# Patient Record
Sex: Female | Born: 1970 | Race: Black or African American | Hispanic: No | Marital: Single | Smoking: Never smoker
Health system: Southern US, Community
[De-identification: ages and names within clinical notes are randomized; demographics above are authoritative.]

## PROBLEM LIST (undated history)

## (undated) DIAGNOSIS — E7800 Pure hypercholesterolemia, unspecified: Secondary | ICD-10-CM

## (undated) HISTORY — PX: CARPAL TUNNEL RELEASE: SHX101

## (undated) HISTORY — PX: FOOT SURGERY: SHX648

## (undated) HISTORY — PX: ABDOMINAL HYSTERECTOMY: SHX81

---

## 2007-09-26 ENCOUNTER — Emergency Department (HOSPITAL_COMMUNITY): Admission: EM | Admit: 2007-09-26 | Discharge: 2007-09-27 | Payer: Self-pay | Admitting: Emergency Medicine

## 2009-05-20 ENCOUNTER — Emergency Department (HOSPITAL_COMMUNITY): Admission: EM | Admit: 2009-05-20 | Discharge: 2009-05-20 | Payer: Self-pay | Admitting: Emergency Medicine

## 2011-05-21 LAB — COMPREHENSIVE METABOLIC PANEL WITH GFR
ALT: 19
AST: 20
Alkaline Phosphatase: 48
CO2: 21
Chloride: 103
GFR calc Af Amer: >60
GFR calc non Af Amer: >60
Glucose, Bld: 130 — ABNORMAL HIGH
Potassium: 3.3 — ABNORMAL LOW
Sodium: 134 — ABNORMAL LOW
Total Bilirubin: 1.2

## 2011-05-21 LAB — URINALYSIS, ROUTINE W REFLEX MICROSCOPIC
Bilirubin Urine: NEGATIVE
Glucose, UA: NEGATIVE
Hgb urine dipstick: NEGATIVE
Ketones, ur: NEGATIVE
Nitrite: NEGATIVE
Protein, ur: NEGATIVE
Specific Gravity, Urine: 1.013
Urobilinogen, UA: 0.2
pH: 5.5

## 2011-05-21 LAB — DIFFERENTIAL
Basophils Absolute: 0
Basophils Relative: 0
Eosinophils Absolute: 0
Eosinophils Relative: 0
Lymphocytes Relative: 4 — ABNORMAL LOW
Lymphs Abs: 0.2 — ABNORMAL LOW
Monocytes Absolute: 0.5
Monocytes Relative: 8
Neutro Abs: 5.5
Neutrophils Relative %: 88 — ABNORMAL HIGH

## 2011-05-21 LAB — COMPREHENSIVE METABOLIC PANEL
Albumin: 3.6
BUN: 5 — ABNORMAL LOW
Calcium: 8.7
Creatinine, Ser: 0.97
Total Protein: 7.4

## 2011-05-21 LAB — POCT PREGNANCY, URINE
Operator id: 151321
Preg Test, Ur: NEGATIVE

## 2011-05-21 LAB — CBC
HCT: 38.8
Hemoglobin: 13.4
MCHC: 34.7
MCV: 88.3
Platelets: 244
RBC: 4.39
RDW: 11.6
WBC: 6.2

## 2011-06-20 ENCOUNTER — Emergency Department (HOSPITAL_BASED_OUTPATIENT_CLINIC_OR_DEPARTMENT_OTHER)
Admission: EM | Admit: 2011-06-20 | Discharge: 2011-06-20 | Disposition: A | Discharge status: Home / Self Care | Payer: BC Managed Care – PPO | Attending: Emergency Medicine | Admitting: Emergency Medicine

## 2011-06-20 ENCOUNTER — Encounter: Payer: Self-pay | Admitting: Emergency Medicine

## 2011-06-20 ENCOUNTER — Emergency Department (INDEPENDENT_AMBULATORY_CARE_PROVIDER_SITE_OTHER): Payer: BC Managed Care – PPO

## 2011-06-20 ENCOUNTER — Other Ambulatory Visit: Payer: Self-pay

## 2011-06-20 DIAGNOSIS — M79609 Pain in unspecified limb: Secondary | ICD-10-CM | POA: Insufficient documentation

## 2011-06-20 DIAGNOSIS — R079 Chest pain, unspecified: Secondary | ICD-10-CM | POA: Insufficient documentation

## 2011-06-20 DIAGNOSIS — R071 Chest pain on breathing: Secondary | ICD-10-CM | POA: Insufficient documentation

## 2011-06-20 DIAGNOSIS — R0789 Other chest pain: Secondary | ICD-10-CM

## 2011-06-20 DIAGNOSIS — M542 Cervicalgia: Secondary | ICD-10-CM | POA: Insufficient documentation

## 2011-06-20 HISTORY — DX: Pure hypercholesterolemia, unspecified: E78.00

## 2011-06-20 LAB — DIFFERENTIAL
Basophils Absolute: 0 10*3/uL (ref 0.0–0.1)
Lymphocytes Relative: 47 % — ABNORMAL HIGH (ref 12–46)
Monocytes Absolute: 0.2 10*3/uL (ref 0.1–1.0)
Neutro Abs: 1.6 10*3/uL — ABNORMAL LOW (ref 1.7–7.7)
Neutrophils Relative %: 46 % (ref 43–77)

## 2011-06-20 LAB — CARDIAC PANEL(CRET KIN+CKTOT+MB+TROPI)
CK, MB: 4 ng/mL (ref 0.3–4.0)
Total CK: 334 U/L — ABNORMAL HIGH (ref 7–177)
Troponin I: <0.3 ng/mL (ref <0.30)

## 2011-06-20 LAB — CBC
HCT: 40.1 % (ref 36.0–46.0)
RDW: 12.7 % (ref 11.5–15.5)
WBC: 3.6 10*3/uL — ABNORMAL LOW (ref 4.0–10.5)

## 2011-06-20 LAB — D-DIMER, QUANTITATIVE: D-Dimer, Quant: 0.23 ug/mL-FEU (ref 0.00–0.48)

## 2011-06-20 MED ORDER — IBUPROFEN 800 MG PO TABS
800.0000 mg | ORAL_TABLET | Freq: Once | ORAL | Status: AC
  Administered 2011-06-20: 800 mg via ORAL
  Filled 2011-06-20: qty 1

## 2011-06-20 MED ORDER — ASPIRIN 81 MG PO CHEW
324.0000 mg | CHEWABLE_TABLET | Freq: Once | ORAL | Status: AC
  Administered 2011-06-20: 324 mg via ORAL
  Filled 2011-06-20: qty 4

## 2011-06-20 NOTE — ED Notes (Signed)
Pt c/o LT chest pain - sharp, intermittent, worse when lying down- since yest am- denies other sx

## 2011-06-20 NOTE — ED Provider Notes (Signed)
History     CSN: [id number] Arrival date & time: 06/20/2011  9:45 AM   First MD Initiated Contact with Patient 06/20/11 0945      Chief Complaint  Patient presents with  . Chest Pain    (Consider location/radiation/quality/duration/timing/severity/associated sxs/prior treatment) Patient is a 40 y.o. female presenting with chest pain.  Chest Pain The chest pain began yesterday. Duration of episode(s) is 1 day. Chest pain occurs constantly. The chest pain is unchanged. Associated with: Worsens with lying down. At its most intense, the pain is at 10/10. The pain is currently at 7/10. The quality of the pain is described as sharp. The pain radiates to the left neck and left arm. Chest pain is worsened by certain positions. Primary symptoms include palpitations and abdominal pain. Pertinent negatives for primary symptoms include no fever, no syncope, no shortness of breath, no cough, no nausea and no vomiting.  The palpitations did not occur with shortness of breath.  She tried nothing for the symptoms. Risk factors include alcohol intake and post-menopausal (NOrmally does not drink but was at [facility] and came home Monday.).     Past Medical History  Diagnosis Date  . Endometriosis   . High cholesterol     Past Surgical History  Procedure Date  . Abdominal hysterectomy   . Foot surgery     No family history on file.  History  Substance Use Topics  . Smoking status: Never Smoker   . Smokeless tobacco: Not on file  . Alcohol Use: Yes     occ    OB History    Grav Para Term Preterm Abortions TAB SAB Ect Mult Living                  Review of Systems  Constitutional: Negative for fever.  Respiratory: Negative for cough and shortness of breath.   Cardiovascular: Positive for chest pain and palpitations. Negative for syncope.  Gastrointestinal: Positive for abdominal pain. Negative for nausea and vomiting.  All other systems reviewed and are  negative.    Allergies  Cephalosporins  Home Medications   Current Outpatient Rx  Name Route Sig Dispense Refill  . GABAPENTIN (PHN) PO Oral Take by mouth daily.        Ht 5\' 6"  (1.676 m)  Wt 190 lb (86.183 kg)  BMI 30.67 kg/m2  Physical Exam  Nursing note and vitals reviewed. Constitutional: She is oriented to person, place, and time. She appears well-developed.  HENT:  Head: Normocephalic and atraumatic.  Right Ear: External ear normal.  Nose: Nose normal.  Mouth/Throat: Oropharynx is clear and moist.  Eyes: Conjunctivae and EOM are normal. Pupils are equal, round, and reactive to light.  Neck: Normal range of motion. Neck supple.  Cardiovascular: Normal rate, regular rhythm, normal heart sounds and intact distal pulses.   Pulmonary/Chest: Effort normal and breath sounds normal. She exhibits tenderness.       Tender left costochondral conjunction  Abdominal: Soft.  Musculoskeletal: Normal range of motion.  Neurological: She is alert and oriented to person, place, and time. She has normal reflexes.  Skin: Skin is warm and dry.  Psychiatric: She has a normal mood and affect. Her behavior is normal. Judgment and thought content normal.    ED Course  Procedures (including critical care time)  Labs Reviewed - No data to display No results found.   No diagnosis found.    MDM   Date: 06/20/2011  Rate: 71  Rhythm: normal sinus  rhythm  QRS Axis: normal  Intervals: normal  ST/T Wave abnormalities: normal  Conduction Disutrbances:none  Narrative Interpretation:   Old EKG Reviewed: none available          Hilario Quarry, MD 06/20/11 1102

## 2012-12-07 IMAGING — CR DG CHEST 2V
2 series · 2 of 2 positions shown · non-contrast
Comparison: None

CLINICAL DATA: 40-year-old female with chest pain

CHEST - 2 VIEW

[w chest pa]
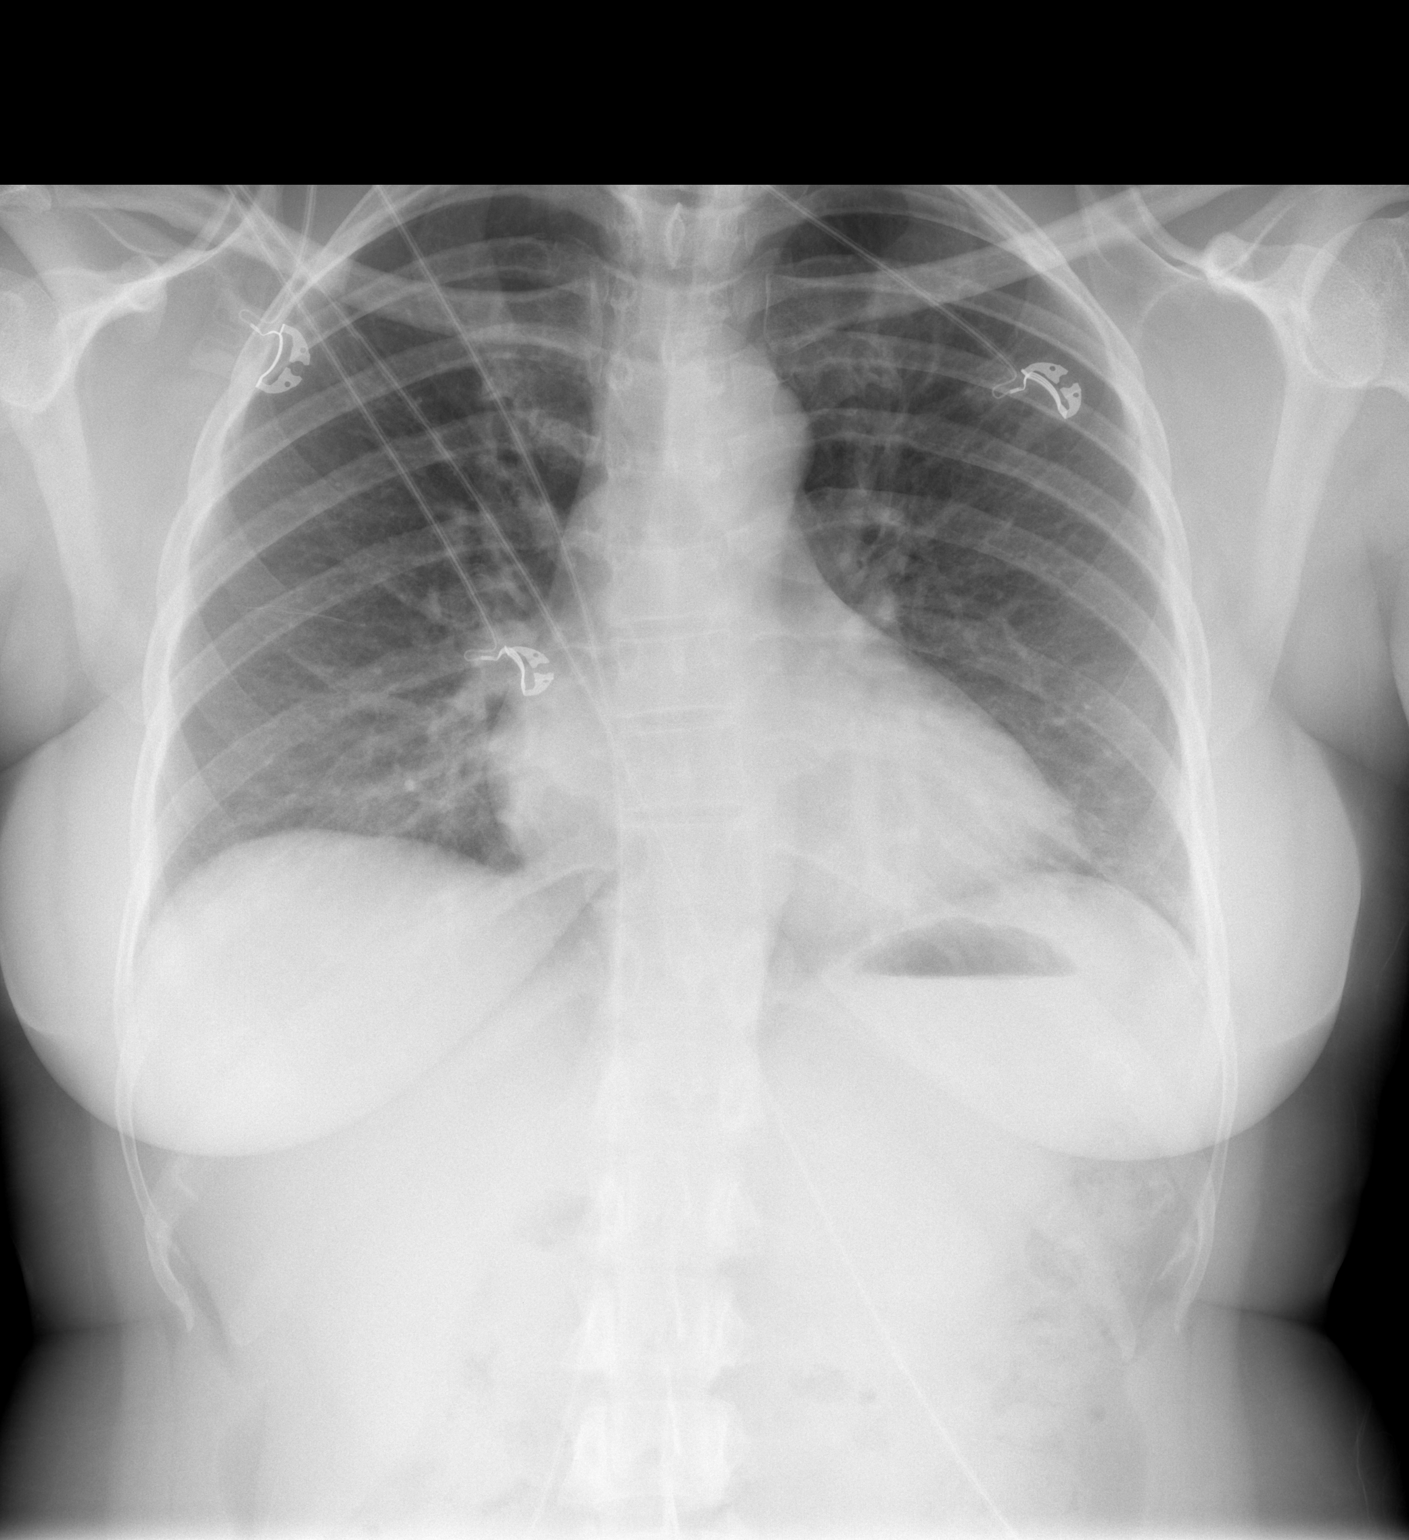

[w chest lat]
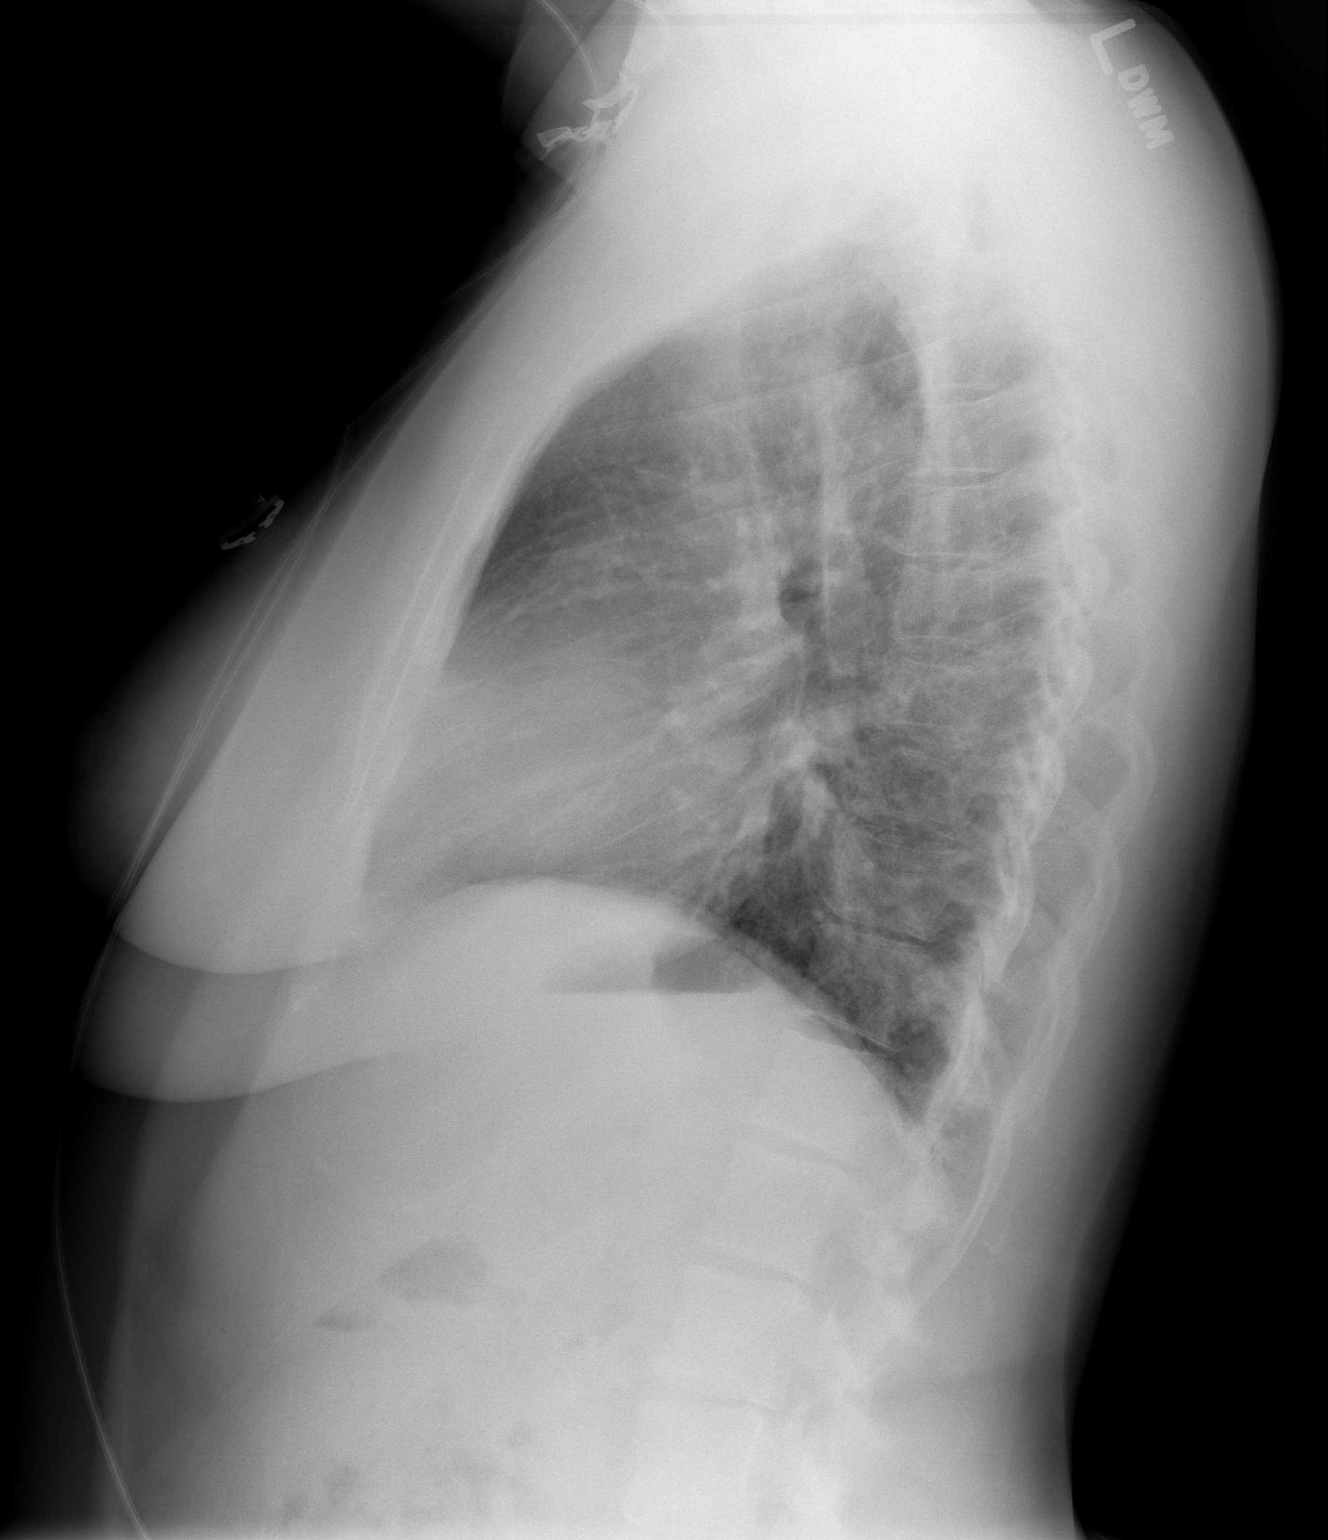

[2 of 2 positions shown; findings below may reference images not displayed]

FINDINGS: The cardiomediastinal silhouette is unremarkable.

There is no evidence of focal airspace disease, pulmonary edema,
pulmonary nodule/mass, pleural effusion, or pneumothorax.
No acute bony abnormalities are identified.
IMPRESSION: No evidence of active cardiopulmonary disease.

## 2019-07-27 ENCOUNTER — Encounter (HOSPITAL_COMMUNITY): Payer: Self-pay

## 2019-07-27 ENCOUNTER — Inpatient Hospital Stay (HOSPITAL_COMMUNITY)
Admission: EM | Admit: 2019-07-27 | Discharge: 2019-08-02 | DRG: 177 | Disposition: A | Discharge status: Home / Self Care | Payer: BC Managed Care – PPO | Attending: Internal Medicine | Admitting: Internal Medicine

## 2019-07-27 ENCOUNTER — Other Ambulatory Visit: Payer: Self-pay

## 2019-07-27 ENCOUNTER — Emergency Department (HOSPITAL_COMMUNITY): Payer: BC Managed Care – PPO

## 2019-07-27 DIAGNOSIS — E785 Hyperlipidemia, unspecified: Secondary | ICD-10-CM | POA: Diagnosis present

## 2019-07-27 DIAGNOSIS — Z90710 Acquired absence of both cervix and uterus: Secondary | ICD-10-CM

## 2019-07-27 DIAGNOSIS — F09 Unspecified mental disorder due to known physiological condition: Secondary | ICD-10-CM | POA: Diagnosis not present

## 2019-07-27 DIAGNOSIS — N809 Endometriosis, unspecified: Secondary | ICD-10-CM | POA: Diagnosis present

## 2019-07-27 DIAGNOSIS — T380X5A Adverse effect of glucocorticoids and synthetic analogues, initial encounter: Secondary | ICD-10-CM | POA: Diagnosis not present

## 2019-07-27 DIAGNOSIS — Z6830 Body mass index (BMI) 30.0-30.9, adult: Secondary | ICD-10-CM | POA: Diagnosis not present

## 2019-07-27 DIAGNOSIS — U071 COVID-19: Secondary | ICD-10-CM | POA: Diagnosis present

## 2019-07-27 DIAGNOSIS — M549 Dorsalgia, unspecified: Secondary | ICD-10-CM | POA: Diagnosis present

## 2019-07-27 DIAGNOSIS — Y92239 Unspecified place in hospital as the place of occurrence of the external cause: Secondary | ICD-10-CM | POA: Diagnosis present

## 2019-07-27 DIAGNOSIS — F19959 Other psychoactive substance use, unspecified with psychoactive substance-induced psychotic disorder, unspecified: Secondary | ICD-10-CM | POA: Diagnosis not present

## 2019-07-27 DIAGNOSIS — J9601 Acute respiratory failure with hypoxia: Secondary | ICD-10-CM | POA: Diagnosis present

## 2019-07-27 DIAGNOSIS — E7800 Pure hypercholesterolemia, unspecified: Secondary | ICD-10-CM | POA: Diagnosis present

## 2019-07-27 DIAGNOSIS — J069 Acute upper respiratory infection, unspecified: Secondary | ICD-10-CM | POA: Diagnosis not present

## 2019-07-27 DIAGNOSIS — E669 Obesity, unspecified: Secondary | ICD-10-CM | POA: Diagnosis present

## 2019-07-27 DIAGNOSIS — Z79899 Other long term (current) drug therapy: Secondary | ICD-10-CM | POA: Diagnosis not present

## 2019-07-27 DIAGNOSIS — J1289 Other viral pneumonia: Secondary | ICD-10-CM | POA: Diagnosis present

## 2019-07-27 DIAGNOSIS — Z888 Allergy status to other drugs, medicaments and biological substances status: Secondary | ICD-10-CM

## 2019-07-27 DIAGNOSIS — R0600 Dyspnea, unspecified: Secondary | ICD-10-CM

## 2019-07-27 DIAGNOSIS — R109 Unspecified abdominal pain: Secondary | ICD-10-CM | POA: Diagnosis present

## 2019-07-27 DIAGNOSIS — J1282 Pneumonia due to coronavirus disease 2019: Secondary | ICD-10-CM

## 2019-07-27 LAB — COMPREHENSIVE METABOLIC PANEL
ALT: 27 U/L (ref 0–44)
AST: 31 U/L (ref 15–41)
Albumin: 3.2 g/dL — ABNORMAL LOW (ref 3.5–5.0)
Alkaline Phosphatase: 37 U/L — ABNORMAL LOW (ref 38–126)
Anion gap: 12 (ref 5–15)
BUN: 12 mg/dL (ref 6–20)
CO2: 24 mmol/L (ref 22–32)
Calcium: 8.7 mg/dL — ABNORMAL LOW (ref 8.9–10.3)
Chloride: 99 mmol/L (ref 98–111)
Creatinine, Ser: 1.09 mg/dL — ABNORMAL HIGH (ref 0.44–1.00)
GFR calc Af Amer: >60 mL/min (ref >60)
GFR calc non Af Amer: >60 mL/min (ref >60)
Glucose, Bld: 89 mg/dL (ref 70–99)
Potassium: 4.4 mmol/L (ref 3.5–5.1)
Sodium: 135 mmol/L (ref 135–145)
Total Bilirubin: 0.6 mg/dL (ref 0.3–1.2)
Total Protein: 7.5 g/dL (ref 6.5–8.1)

## 2019-07-27 LAB — CBG MONITORING, ED: Glucose-Capillary: 116 mg/dL — ABNORMAL HIGH (ref 70–99)

## 2019-07-27 LAB — CBC WITH DIFFERENTIAL/PLATELET
Abs Immature Granulocytes: 0.02 10*3/uL (ref 0.00–0.07)
Basophils Absolute: 0 10*3/uL (ref 0.0–0.1)
Basophils Relative: 0 %
Eosinophils Absolute: 0 10*3/uL (ref 0.0–0.5)
Eosinophils Relative: 0 %
HCT: 42.8 % (ref 36.0–46.0)
Hemoglobin: 13.8 g/dL (ref 12.0–15.0)
Immature Granulocytes: 0 %
Lymphocytes Relative: 17 %
Lymphs Abs: 1 10*3/uL (ref 0.7–4.0)
MCH: 30.1 pg (ref 26.0–34.0)
MCHC: 32.2 g/dL (ref 30.0–36.0)
MCV: 93.2 fL (ref 80.0–100.0)
Monocytes Absolute: 0.3 10*3/uL (ref 0.1–1.0)
Monocytes Relative: 6 %
Neutro Abs: 4.5 10*3/uL (ref 1.7–7.7)
Neutrophils Relative %: 77 %
Platelets: 217 10*3/uL (ref 150–400)
RBC: 4.59 MIL/uL (ref 3.87–5.11)
RDW: 11.8 % (ref 11.5–15.5)
WBC: 5.9 10*3/uL (ref 4.0–10.5)
nRBC: 0 % (ref 0.0–0.2)

## 2019-07-27 LAB — FIBRINOGEN: Fibrinogen: 641 mg/dL — ABNORMAL HIGH (ref 210–475)

## 2019-07-27 LAB — FERRITIN: Ferritin: 393 ng/mL — ABNORMAL HIGH (ref 11–307)

## 2019-07-27 LAB — LACTIC ACID, PLASMA: Lactic Acid, Venous: 1.4 mmol/L (ref 0.5–1.9)

## 2019-07-27 LAB — LACTATE DEHYDROGENASE: LDH: 238 U/L — ABNORMAL HIGH (ref 98–192)

## 2019-07-27 LAB — PROCALCITONIN: Procalcitonin: <0.1 ng/mL

## 2019-07-27 LAB — TRIGLYCERIDES: Triglycerides: 161 mg/dL — ABNORMAL HIGH (ref <150)

## 2019-07-27 LAB — D-DIMER, QUANTITATIVE: D-Dimer, Quant: 0.6 ug/mL-FEU — ABNORMAL HIGH (ref 0.00–0.50)

## 2019-07-27 LAB — C-REACTIVE PROTEIN: CRP: 3 mg/dL — ABNORMAL HIGH (ref <1.0)

## 2019-07-27 MED ORDER — DOCUSATE SODIUM 100 MG PO CAPS
100.0000 mg | ORAL_CAPSULE | Freq: Two times a day (BID) | ORAL | Status: DC
  Administered 2019-07-27 – 2019-08-02 (×11): 100 mg via ORAL
  Filled 2019-07-27 (×12): qty 1

## 2019-07-27 MED ORDER — BISACODYL 5 MG PO TBEC
5.0000 mg | DELAYED_RELEASE_TABLET | Freq: Every day | ORAL | Status: DC | PRN
  Administered 2019-07-28: 5 mg via ORAL
  Filled 2019-07-27: qty 1

## 2019-07-27 MED ORDER — OXYCODONE HCL 5 MG PO TABS
5.0000 mg | ORAL_TABLET | ORAL | Status: DC | PRN
  Administered 2019-07-27 – 2019-07-31 (×4): 5 mg via ORAL
  Filled 2019-07-27 (×4): qty 1

## 2019-07-27 MED ORDER — INFLUENZA VAC SPLIT QUAD 0.5 ML IM SUSY: 0.5000 mL | PREFILLED_SYRINGE | INTRAMUSCULAR | Status: DC

## 2019-07-27 MED ORDER — SODIUM CHLORIDE 0.9 % IV SOLN
100.0000 mg | INTRAVENOUS | Status: AC
  Administered 2019-07-28 – 2019-07-31 (×4): 100 mg via INTRAVENOUS
  Filled 2019-07-27 (×3): qty 20
  Filled 2019-07-27 (×2): qty 100

## 2019-07-27 MED ORDER — SODIUM CHLORIDE 0.9% FLUSH: 3.0000 mL | INTRAVENOUS | Status: DC | PRN

## 2019-07-27 MED ORDER — ONDANSETRON HCL 4 MG PO TABS
4.0000 mg | ORAL_TABLET | Freq: Four times a day (QID) | ORAL | Status: DC | PRN
  Filled 2019-07-27: qty 1

## 2019-07-27 MED ORDER — ACETAMINOPHEN 325 MG PO TABS
650.0000 mg | ORAL_TABLET | Freq: Four times a day (QID) | ORAL | Status: DC | PRN
  Administered 2019-08-01: 650 mg via ORAL
  Filled 2019-07-27: qty 2

## 2019-07-27 MED ORDER — DEXAMETHASONE SODIUM PHOSPHATE 10 MG/ML IJ SOLN
10.0000 mg | Freq: Once | INTRAMUSCULAR | Status: AC
  Administered 2019-07-27: 10 mg via INTRAVENOUS
  Filled 2019-07-27: qty 1

## 2019-07-27 MED ORDER — SODIUM CHLORIDE 0.9% FLUSH
3.0000 mL | Freq: Two times a day (BID) | INTRAVENOUS | Status: DC
  Administered 2019-07-27 – 2019-08-02 (×10): 3 mL via INTRAVENOUS

## 2019-07-27 MED ORDER — METHYLPREDNISOLONE SODIUM SUCC 125 MG IJ SOLR
0.5000 mg/kg | Freq: Two times a day (BID) | INTRAMUSCULAR | Status: DC
  Filled 2019-07-27: qty 2

## 2019-07-27 MED ORDER — ACETAMINOPHEN 500 MG PO TABS
1000.0000 mg | ORAL_TABLET | Freq: Once | ORAL | Status: AC
  Administered 2019-07-27: 1000 mg via ORAL
  Filled 2019-07-27: qty 2

## 2019-07-27 MED ORDER — HYDROCODONE-HOMATROPINE 5-1.5 MG/5ML PO SYRP
5.0000 mL | ORAL_SOLUTION | ORAL | Status: DC | PRN
  Administered 2019-07-28: 5 mL via ORAL
  Filled 2019-07-27: qty 5

## 2019-07-27 MED ORDER — AEROCHAMBER PLUS FLO-VU MEDIUM MISC
1.0000 | Freq: Once | Status: DC
  Filled 2019-07-27 (×3): qty 1

## 2019-07-27 MED ORDER — ALBUTEROL SULFATE HFA 108 (90 BASE) MCG/ACT IN AERS
6.0000 | INHALATION_SPRAY | Freq: Once | RESPIRATORY_TRACT | Status: AC
  Administered 2019-07-27: 6 via RESPIRATORY_TRACT
  Filled 2019-07-27: qty 6.7

## 2019-07-27 MED ORDER — ZOLPIDEM TARTRATE 5 MG PO TABS: 5.0000 mg | ORAL_TABLET | Freq: Every evening | ORAL | Status: DC | PRN

## 2019-07-27 MED ORDER — SODIUM CHLORIDE 0.9% FLUSH
3.0000 mL | Freq: Two times a day (BID) | INTRAVENOUS | Status: DC
  Administered 2019-07-27 – 2019-07-31 (×9): 3 mL via INTRAVENOUS

## 2019-07-27 MED ORDER — SODIUM CHLORIDE 0.9 % IV SOLN
250.0000 mL | INTRAVENOUS | Status: DC | PRN
  Administered 2019-07-30: 250 mL via INTRAVENOUS

## 2019-07-27 MED ORDER — ESTRADIOL 0.1 MG/24HR TD PTWK
0.1000 mg | MEDICATED_PATCH | TRANSDERMAL | Status: DC
  Administered 2019-07-27: 20:00:00 0.1 mg via TRANSDERMAL
  Filled 2019-07-27: qty 1

## 2019-07-27 MED ORDER — ALBUTEROL SULFATE HFA 108 (90 BASE) MCG/ACT IN AERS
6.0000 | INHALATION_SPRAY | RESPIRATORY_TRACT | Status: DC | PRN
  Filled 2019-07-27: qty 6.7

## 2019-07-27 MED ORDER — METHYLPREDNISOLONE SODIUM SUCC 125 MG IJ SOLR
0.5000 mg/kg | Freq: Two times a day (BID) | INTRAMUSCULAR | Status: DC
  Administered 2019-07-28 – 2019-07-29 (×2): 43.125 mg via INTRAVENOUS
  Filled 2019-07-27 (×2): qty 2

## 2019-07-27 MED ORDER — SODIUM CHLORIDE 0.9 % IV SOLN
200.0000 mg | Freq: Once | INTRAVENOUS | Status: AC
  Administered 2019-07-27: 200 mg via INTRAVENOUS
  Filled 2019-07-27: qty 40

## 2019-07-27 MED ORDER — ENOXAPARIN SODIUM 40 MG/0.4ML ~~LOC~~ SOLN
40.0000 mg | SUBCUTANEOUS | Status: DC
  Administered 2019-07-27 – 2019-08-01 (×6): 40 mg via SUBCUTANEOUS
  Filled 2019-07-27 (×7): qty 0.4

## 2019-07-27 MED ORDER — POLYETHYLENE GLYCOL 3350 17 G PO PACK: 17.0000 g | PACK | Freq: Every day | ORAL | Status: DC | PRN

## 2019-07-27 MED ORDER — ONDANSETRON HCL 4 MG/2ML IJ SOLN
4.0000 mg | Freq: Four times a day (QID) | INTRAMUSCULAR | Status: DC | PRN
  Administered 2019-07-28 – 2019-07-30 (×2): 4 mg via INTRAVENOUS
  Filled 2019-07-27 (×3): qty 2

## 2019-07-27 NOTE — H&P (Signed)
History and Physical    Brenda Chambers:[id number] DOB: 1971/04/26 DOA: 07/27/2019  PCP: Patient, No Pcp Per Consultants:  None Patient coming from:  Home - lives with wife; NOK:  Wife, [place], [phone]  Chief Complaint: SOB  HPI: Brenda Chambers is a 48 y.o. female with medical history significant of HLD and endometriosis presenting with worsening COVID-19 infection.  Patient had a friend who came to visit; 2 days after returning home, the friend tested positive for COVID-19 infection.  The patient developed symptoms about 11/16 with fever, abdominal/flank/back pain, myalgias, and dry cough.  She went to urgent care on 11/21 and had positive COVID testing (BD).  She has been in quarantine but developed SOB and cough since then.  During the past 24 hours, she thought she would die if she stayed home and so decided to come to the ER for further evaluation.   ED Course:   COVID + on 11/21.  Tachypnea, hypoxia to 80s, typical symptoms.  Completed steroids since weekend without relief.  CXR with COVID changes.  Review of Systems: As per HPI; otherwise review of systems reviewed and negative.   Ambulatory Status:  Ambulates without assistance  Past Medical History:  Diagnosis Date  . Endometriosis   . High cholesterol     Past Surgical History:  Procedure Laterality Date  . ABDOMINAL HYSTERECTOMY    . FOOT SURGERY      Social History   Socioeconomic History  . Marital status: Single    Spouse name: Not on file  . Number of children: Not on file  . Years of education: Not on file  . Highest education level: Not on file  Occupational History  . Not on file  Social Needs  . Financial resource strain: Not on file  . Food insecurity    Worry: Not on file    Inability: Not on file  . Transportation needs    Medical: Not on file    Non-medical: Not on file  Tobacco Use  . Smoking status: Never Smoker  . Smokeless tobacco: Never Used  Substance and Sexual  Activity  . Alcohol use: Yes    Comment: occ  . Drug use: No  . Sexual activity: Not on file  Lifestyle  . Physical activity    Days per week: Not on file    Minutes per session: Not on file  . Stress: Not on file  Relationships  . Social Musician on phone: Not on file    Gets together: Not on file    Attends religious service: Not on file    Active member of club or organization: Not on file    Attends meetings of clubs or organizations: Not on file    Relationship status: Not on file  . Intimate partner violence    Fear of current or ex partner: Not on file    Emotionally abused: Not on file    Physically abused: Not on file    Forced sexual activity: Not on file  Other Topics Concern  . Not on file  Social History Narrative  . Not on file    Allergies  Allergen Reactions  . Cephalosporins Itching    No family history on file.  Prior to Admission medications   Medication Sig Start Date End Date Taking? Authorizing Provider  albuterol (PROVENTIL) (2.5 MG/3ML) 0.083% nebulizer solution Take 2.5 mg by nebulization 3 (three) times daily. 07/26/19  Yes [provider]  albuterol (VENTOLIN HFA) 108 (90 Base) MCG/ACT inhaler Inhale 1-2 puffs into the lungs every 4 (four) hours as needed for shortness of breath or wheezing. May take every 3 hours. 07/25/19  Yes [provider]  azithromycin (ZITHROMAX) 250 MG tablet Take 500 mg by mouth daily. 07/25/19  Yes [provider]  dexamethasone (DECADRON) 6 MG tablet Take 6 mg by mouth daily. For 10 days 07/22/19  Yes [provider]  estradiol (VIVELLE-DOT) 0.075 MG/24HR Place 1 patch onto the skin 2 (two) times a week. 06/19/19  Yes [provider]  HYDROMET 5-1.5 MG/5ML syrup Take 5 mLs by mouth every 4 (four) hours as needed. 07/22/19  Yes [provider]  methylPREDNISolone (MEDROL DOSEPAK) 4 MG TBPK tablet Take 1-6 tablets by mouth See admin instructions. 6 day  taper 6,5,4,3,2,1, follow package directions. 07/25/19  Yes [provider]    Physical Exam: Vitals:   07/27/19 1430 07/27/19 1445 07/27/19 1500 07/27/19 1530  BP: (!) 123/91  113/78 113/81  Pulse: 90 99 (!) 106 (!) 111  Resp: (!) 24 (!) 27 (!) 23 (!) 29  Temp:      TempSrc:      SpO2: 95% 96% 92% 93%  Weight:      Height:         . General:  Appears ill, lying naked but diaphoretic, on 3L [place] O2 . Eyes:  PERRL, EOMI, normal lids, iris . ENT:  grossly normal hearing, lips & tongue, mmm . Neck:  no LAD, masses or thyromegaly . Cardiovascular:  RRR, no m/r/g. No LE edema.  Marland Kitchen. Respiratory:   Mild diffuse rhonchi.   Mildly increased respiratory effort. . Abdomen:  soft, NT, ND, NABS . Skin:  no rash or induration seen on limited exam . Musculoskeletal:  grossly normal tone BUE/BLE, good ROM, no bony abnormality . Psychiatric:  grossly normal mood and affect, speech fluent and appropriate, AOx3 . Neurologic:  CN 2-12 grossly intact, moves all extremities in coordinated fashion, sensation intact    Radiological Exams on Admission: Dg Chest Portable 1 View  Result Date: 07/27/2019 CLINICAL DATA:  Shortness of breath, COVID positive. EXAM: PORTABLE CHEST 1 VIEW COMPARISON:  06/20/2011 FINDINGS: Lung volumes are low. Cardiomediastinal contours are normal accounting for low lung volumes. Patchy opacities are seen bilaterally, worse at the lung bases with some peripheral predominance. Low lung volumes limit assessment. No signs of acute bone process. IMPRESSION: Low volume chest with findings that could be seen in the setting of atypical or viral pneumonia. Electronically Signed   By: Donzetta KohutGeoffrey  Wile M.D.   On: 07/27/2019 14:35    EKG: not done   Labs on Admission: I have personally reviewed the available labs and imaging studies at the time of the admission.  Pertinent labs:   BUN 12/Creatinine 1.09/GFR >60 LDH 238 Ferritin 393 CRP 3.0 Lactate 1.4 Normal CBC D-dimer  0.60 Fibrinogen 641 Procalcitonin <0.10 Blood cultures pending   Assessment/Plan Principal Problem:   Acute respiratory disease due to COVID-19 virus Active Problems:   Dyslipidemia   Endometriosis   Acute respiratory failure with hypoxia -Patient with presenting with worsening SOB and persistent fever in the setting of COVID-19 infection with positive test on 11/21 -She does not have a usual home O2 requirement and is currently requiring 3L [place] O2 -COVID POSITIVE -Pertinent labs concerning for COVID include normal WBC count; low procalcitonin; elevated D-dimer (but not >1); elevated CRP (but not >7); increased LDH; increased ferritin; increased fibrinogen -CXR with  multifocal opacities which may be c/w COVID  -Will not treat with broad-spectrum antibiotics given procalcitonin <0.1; of note, she has been taking azithromycin (and also Decadron AND medrol dose pack) at home and so if she has clinical worsening would have low threshold for adding broad spectrum antibiotics -Will admit to [facility] (on <4L [address] O2) for further evaluation, close monitoring, and treatment -Monitor on telemetry x at least 24 hours -At this time, will attempt to avoid use of aerosolized medications and use HFAs instead -Will check daily labs including BMP with Mag, Phos; LFTs; CBC with differential; CRP; ferritin; fibrinogen; D-dimer -Will order steroids (1 mg/kg divided BID) and Remdesivir (pharmacy consult) given +COVID test, +CXR, and hypoxia <94% on room air -If the patient shows clinical deterioration, consider transfer to ICU with PCCM consultation -If the patient is hypoxic or on >3L oxygen from baseline or CRP >7, considerTocilizumab 8 mg/kg x1 IF + COVID test; O2 sats <88% on room air; and patient is at high risk for intubation.   -Will attempt to maintain euvolemia to a net negative fluid status -Will ask the patient to maintain an awake prone position for 16+ hours a day, if possible, with a minimum of 2-3  hours at a time -With D-dimer <5, will use standard-dosed Lovenox for DVT prevention -Patient was seen wearing full PPE including: gown, gloves, head cover, N95, and face shield; donning and doffing was in compliance with current standards.  HLD -She does not appear to be taking chronic medications for this issue  Endometriosis -Continue estradiol (formulary substitution of Climara for Vivelle)     DVT prophylaxis:  Lovenox  Code Status:  Full - confirmed with patient Family Communication: None present; the patient did not request that I speak with her wife at this time, Disposition Plan:  Home once clinically improved Consults called: None  Admission status: Admit - It is my clinical opinion that admission to INPATIENT is reasonable and necessary because of the expectation that this patient will require hospital care that crosses at least 2 midnights to treat this condition based on the medical complexity of the problems presented.  Given the aforementioned information, the predictability of an adverse outcome is felt to be significant.     Karmen Bongo MD [facility]   How to contact the [facility] Attending or Consulting provider [place] or covering provider during after hours [place], for this patient?  1. Check the care team in [facility] and look for a) attending/consulting [facility] provider listed and b) the [facility] team listed 2. Log into [email or web address] and use [place]'s universal password to access. If you do not have the password, please contact the hospital operator. 3. Locate the [facility] provider you are looking for under [facility] and page to a number that you can be directly reached. 4. If you still have difficulty reaching the provider, please page the [facility] (Director on Call) for the Hospitalists listed on amion for assistance.   07/27/2019, 4:00 PM

## 2019-07-27 NOTE — ED Notes (Signed)
Placed pt is on 4L 02 per [place]

## 2019-07-27 NOTE — ED Notes (Signed)
Report given to Michelle, RN on 2W 

## 2019-07-27 NOTE — ED Provider Notes (Signed)
[facility] EMERGENCY DEPARTMENT Provider Note   CSN: [id number] Arrival date & time: 07/27/19  1337     History   Chief Complaint Chief Complaint  Patient presents with   Shortness of Breath    HPI Brenda Chambers is a 48 y.o. female with a hx of hypercholesterolemia presents to the emergency department with worsening shortness of breath over the past 1 week in the setting of known positive COVID-19 testing.  Patient states she is had a variety of symptoms including nasal congestion, loss of taste/smell, poor appetite, cough that is minimally productive, shortness of breath, abdominal discomfort, body aches, fever, and chills.  She states that the shortness of breath is the most aggravating of her symptoms, it is worse when she exerts herself, no alleviating factors.  She was given a course of prednisone which she completed without relief.  She has been feeling poorly for the past 8 days, tested + 07/22/2019 at urgent care.  Denies chest pain, nausea, vomiting, diarrhea, or unilateral leg pain/swelling.     HPI  Past Medical History:  Diagnosis Date   Endometriosis    High cholesterol     There are no active problems to display for this patient.   Past Surgical History:  Procedure Laterality Date   ABDOMINAL HYSTERECTOMY     FOOT SURGERY       OB History   No obstetric history on file.      Home Medications    Prior to Admission medications   Medication Sig Start Date End Date Taking? Authorizing Provider  GABAPENTIN, PHN, PO Take by mouth daily.      [provider]    Family History No family history on file.  Social History Social History   Tobacco Use   Smoking status: Never Smoker  Substance Use Topics   Alcohol use: Yes    Comment: occ   Drug use: No     Allergies   Cephalosporins   Review of Systems Review of Systems  Constitutional: Positive for appetite change, chills, fatigue and fever.  HENT:  Positive for congestion.   Respiratory: Positive for cough and shortness of breath.   Cardiovascular: Negative for chest pain and leg swelling.  Gastrointestinal: Positive for abdominal pain. Negative for blood in stool, diarrhea, nausea and vomiting.  Musculoskeletal: Positive for myalgias.  Neurological: Negative for syncope.  All other systems reviewed and are negative.    Physical Exam Updated Vital Signs BP 118/78    Pulse 92    Temp (!) 103.1 F (39.5 C) (Oral)    Resp (!) 26    SpO2 (!) 80%   Physical Exam Vitals signs and nursing note reviewed.  Constitutional:      General: She is not in acute distress.    Appearance: She is well-developed. She is ill-appearing.  HENT:     Head: Normocephalic and atraumatic.  Eyes:     General:        Right eye: No discharge.        Left eye: No discharge.     Conjunctiva/sclera: Conjunctivae normal.  Neck:     Musculoskeletal: Neck supple.  Cardiovascular:     Rate and Rhythm: Normal rate and regular rhythm.  Pulmonary:     Effort: Tachypnea present. No respiratory distress.     Breath sounds: No wheezing, rhonchi or rales.     Comments: Desaturates into upper 80s @ rest on RA. Improved to mid 90s on 2L via [place].  Abdominal:     General: There is no distension.     Palpations: Abdomen is soft.     Tenderness: There is no abdominal tenderness.  Skin:    General: Skin is warm and dry.     Findings: No rash.  Neurological:     Mental Status: She is alert.     Comments: Clear speech.   Psychiatric:        Behavior: Behavior normal.    ED Treatments / Results  Labs (all labs ordered are listed, but only abnormal results are displayed) Labs Reviewed  COMPREHENSIVE METABOLIC PANEL - Abnormal; Notable for the following components:      Result Value   Creatinine, Ser 1.09 (*)    Calcium 8.7 (*)    Albumin 3.2 (*)    Alkaline Phosphatase 37 (*)    All other components within normal limits  D-DIMER, QUANTITATIVE (NOT AT [facility])  - Abnormal; Notable for the following components:   D-Dimer, Quant 0.60 (*)    All other components within normal limits  LACTATE DEHYDROGENASE - Abnormal; Notable for the following components:   LDH 238 (*)    All other components within normal limits  TRIGLYCERIDES - Abnormal; Notable for the following components:   Triglycerides 161 (*)    All other components within normal limits  FIBRINOGEN - Abnormal; Notable for the following components:   Fibrinogen 641 (*)    All other components within normal limits  CULTURE, BLOOD (ROUTINE X 2)  CULTURE, BLOOD (ROUTINE X 2)  LACTIC ACID, PLASMA  CBC WITH DIFFERENTIAL/PLATELET  LACTIC ACID, PLASMA  PROCALCITONIN  FERRITIN  C-REACTIVE PROTEIN    EKG EKG Interpretation  Date/Time:  Thursday July 27 2019 13:37:42 EST Ventricular Rate:  99 PR Interval:    QRS Duration: 77 QT Interval:  331 QTC Calculation: 423 R Axis:   -48 Text Interpretation: Sinus rhythm Ventricular premature complex Aberrant conduction of SV complex(es) LAD, consider left anterior fascicular block Abnormal ECG Confirmed by Gerhard MunchLockwood, Robert [phone]) on 07/27/2019 3:05:18 PM   Radiology Dg Chest Portable 1 View  Result Date: 07/27/2019 CLINICAL DATA:  Shortness of breath, COVID positive. EXAM: PORTABLE CHEST 1 VIEW COMPARISON:  06/20/2011 FINDINGS: Lung volumes are low. Cardiomediastinal contours are normal accounting for low lung volumes. Patchy opacities are seen bilaterally, worse at the lung bases with some peripheral predominance. Low lung volumes limit assessment. No signs of acute bone process. IMPRESSION: Low volume chest with findings that could be seen in the setting of atypical or viral pneumonia. Electronically Signed   By: Donzetta KohutGeoffrey  Wile M.D.   On: 07/27/2019 14:35    Procedures .Critical Care Performed by: Cherly AndersonPetrucelli, Jonica Bickhart R, PA-C Authorized by: Cherly AndersonPetrucelli, Aleanna Menge R, PA-C    CRITICAL CARE Performed by: Harvie HeckSamantha Charliegh Vasudevan   Total  critical care time: 30 minutes  Critical care time was exclusive of separately billable procedures and treating other patients.  Critical care was necessary to treat or prevent imminent or life-threatening deterioration.  Critical care was time spent personally by me on the following activities: development of treatment plan with patient and/or surrogate as well as nursing, discussions with consultants, evaluation of patient's response to treatment, examination of patient, obtaining history from patient or surrogate, ordering and performing treatments and interventions, ordering and review of laboratory studies, ordering and review of radiographic studies, pulse oximetry and re-evaluation of patient's condition.   (including critical care time)  Medications Ordered in ED Medications  AeroChamber Plus Flo-Vu Medium MISC 1 each (has  no administration in time range)  dexamethasone (DECADRON) injection 10 mg (10 mg Intravenous Given 07/27/19 1434)  albuterol (VENTOLIN HFA) 108 (90 Base) MCG/ACT inhaler 6 puff (6 puffs Inhalation Given 07/27/19 1435)  acetaminophen (TYLENOL) tablet 1,000 mg (1,000 mg Oral Given 07/27/19 1435)     Initial Impression / Assessment and Plan / ED Course  I have reviewed the triage vital signs and the nursing notes.  Pertinent labs & imaging results that were available during my care of the patient were reviewed by me and considered in my medical decision making (see chart for details).   Patient with known positive COVID-19 testing 07/22/2019 (reviewed via care everywhere) presents to the emergency department for progressively worsening respiratory symptoms.  She is somewhat ill-appearing, tachypneic, and hypoxic on room air.  Febrile to 103.1.  Lung sounds are relatively clear.  Abdomen without tenderness or peritoneal signs.  Decadron, albuterol neb, and Tylenol ordered.  Will obtain labs, chest x-ray, EKG, anticipate admission.  CBC: No leukocytosis, leukopenia,  anemia, or thrombocytopenia. CMP: Creatinine mildly elevated 1.09 compared to 0.97 previously.  Mild hypocalcemia.  LFTs WNL. Lactic acid: WNL Several elevated inflammatory markers. EKG: No STEMI.  CXR: Low volume chest with findings that could be seen in the setting of atypical or viral pneumonia.   Given tachypnea & hypoxia in setting of covid 19 will consult for admission.   Findings and plan of care discussed with supervising physician Dr. Vanita Panda who is in agreement.   15:26: CONSULT: Discussed with hospitalist Dr. Lorin Mercy- accepts admission.   Brenda Chambers was evaluated in Emergency Department on 07/27/2019 for the symptoms described in the history of present illness. He/she was evaluated in the context of the global COVID-19 pandemic, which necessitated consideration that the patient might be at risk for infection with the SARS-CoV-2 virus that causes COVID-19. Institutional protocols and algorithms that pertain to the evaluation of patients at risk for COVID-19 are in a state of rapid change based on information released by regulatory bodies including the CDC and federal and state organizations. These policies and algorithms were followed during the patient's care in the ED.  Final Clinical Impressions(s) / ED Diagnoses   Final diagnoses:  COVID-19  Acute hypoxemic respiratory failure [facility])    ED Discharge Orders    None       Amaryllis Dyke, PA-C 07/27/19 1534    Carmin Muskrat, MD 07/28/19 [phone]

## 2019-07-27 NOTE — ED Notes (Signed)
ED TO INPATIENT HANDOFF REPORT  ED Nurse Name and Phone #: Percival Spanishlana [phone]  S Name/Age/Gender Brenda Chambers 48 y.o. female Room/Bed: 012C/012C  Code Status   Code Status: Full Code  Home/SNF/Other Home Patient oriented to: self, place, time and situation Is this baseline? Yes   Triage Complete: Triage complete  Chief Complaint Covid  Triage Note Pt tested COVID + this past Saturday. Pt c/o SOBx5 days. Pt states SOB has been getting progressively worse. Pt is A&Ox4. NSR on monitor. Pt states productive cough w/phlegm, but doesn;t see it to know what color. Pt is tachypneic.   Allergies Allergies  Allergen Reactions  . Cephalosporins Itching    Level of Care/Admitting Diagnosis ED Disposition    ED Disposition Condition Comment   Admit  [facility] [[id number]]  Level of Care: Telemetry Medical [104]  Covid Evaluation: Confirmed COVID Positive  Diagnosis: Acute respiratory disease due to COVID-19 virus [[id number]][[phone]]  Admitting Physician: Jonah BlueYATES, JENNIFER [[id number]]  Attending Physician: Jonah BlueYATES, JENNIFER [[id number]]  Estimated length of stay: 5 - 7 days  Certification:: I certify this patient will need inpatient services for at least 2 midnights  PT Class (Do Not Modify): Inpatient [101]  PT Acc Code (Do Not Modify): Private [1]       B Medical/Surgery History Past Medical History:  Diagnosis Date  . Endometriosis   . High cholesterol    Past Surgical History:  Procedure Laterality Date  . ABDOMINAL HYSTERECTOMY    . FOOT SURGERY       A IV Location/Drains/Wounds Patient Lines/Drains/Airways Status   Active Line/Drains/Airways    Name:   Placement date:   Placement time:   Site:   Days:   Peripheral IV 07/27/19 Left Forearm   07/27/19    -    Forearm   less than 1   Peripheral IV 07/27/19 Left Antecubital   07/27/19    1411    Antecubital   less than 1          Intake/Output Last 24 hours  Intake/Output Summary (Last 24  hours) at 07/27/2019 2022 Last data filed at 07/27/2019 1832 Gross per 24 hour  Intake 250 ml  Output -  Net 250 ml    Labs/Imaging Results for orders placed or performed during the hospital encounter of 07/27/19 (from the past 48 hour(s))  Lactic acid, plasma     Status: None   Collection Time: 07/27/19  2:08 PM  Result Value Ref Range   Lactic Acid, Venous 1.4 0.5 - 1.9 mmol/L    Comment: Performed at [facility], [address] [place], [place] [address]  CBC WITH DIFFERENTIAL     Status: None   Collection Time: 07/27/19  2:08 PM  Result Value Ref Range   WBC 5.9 4.0 - 10.5 K/uL   RBC 4.59 3.87 - 5.11 MIL/uL   Hemoglobin 13.8 12.0 - 15.0 g/dL   HCT 56.242.8 13.036.0 - 86.546.0 %   MCV 93.2 80.0 - 100.0 fL   MCH 30.1 26.0 - 34.0 pg   MCHC 32.2 30.0 - 36.0 g/dL   RDW 78.411.8 69.611.5 - 29.515.5 %   Platelets 217 150 - 400 K/uL   nRBC 0.0 0.0 - 0.2 %   Neutrophils Relative % 77 %   Neutro Abs 4.5 1.7 - 7.7 K/uL   Lymphocytes Relative 17 %   Lymphs Abs 1.0 0.7 - 4.0 K/uL   Monocytes Relative 6 %   Monocytes Absolute 0.3  0.1 - 1.0 K/uL   Eosinophils Relative 0 %   Eosinophils Absolute 0.0 0.0 - 0.5 K/uL   Basophils Relative 0 %   Basophils Absolute 0.0 0.0 - 0.1 K/uL   Immature Granulocytes 0 %   Abs Immature Granulocytes 0.02 0.00 - 0.07 K/uL    Comment: Performed at [facility], [place] [address] [place], [place] [address]  Comprehensive metabolic panel     Status: Abnormal   Collection Time: 07/27/19  2:08 PM  Result Value Ref Range   Sodium 135 135 - 145 mmol/L   Potassium 4.4 3.5 - 5.1 mmol/L   Chloride 99 98 - 111 mmol/L   CO2 24 22 - 32 mmol/L   Glucose, Bld 89 70 - 99 mg/dL   BUN 12 6 - 20 mg/dL   Creatinine, Ser 1.09 (H) 0.44 - 1.00 mg/dL   Calcium 8.7 (L) 8.9 - 10.3 mg/dL   Total Protein 7.5 6.5 - 8.1 g/dL   Albumin 3.2 (L) 3.5 - 5.0 g/dL   AST 31 15 - 41 U/L   ALT 27 0 - 44 U/L   Alkaline Phosphatase 37 (L) 38 - 126 U/L   Total Bilirubin 0.6 0.3 - 1.2 mg/dL    GFR calc non Af Amer >60 >60 mL/min   GFR calc Af Amer >60 >60 mL/min   Anion gap 12 5 - 15    Comment: Performed at [facility] [address] [place], [place] [address]  D-dimer, quantitative     Status: Abnormal   Collection Time: 07/27/19  2:08 PM  Result Value Ref Range   D-Dimer, Quant 0.60 (H) 0.00 - 0.50 ug/mL-FEU    Comment: (NOTE) At the manufacturer cut-off of 0.50 ug/mL FEU, this assay has been documented to exclude PE with a sensitivity and negative predictive value of 97 to 99%.  At this time, this assay has not been approved by the FDA to exclude DVT/VTE. Results should be correlated with clinical presentation. Performed at [facility], [place] [address], [place], [place] [address]   Procalcitonin     Status: None   Collection Time: 07/27/19  2:08 PM  Result Value Ref Range   Procalcitonin <0.10 ng/mL    Comment:        Interpretation: PCT (Procalcitonin) <= 0.5 ng/mL: Systemic infection (sepsis) is not likely. Local bacterial infection is possible. (NOTE)       Sepsis PCT Algorithm           Lower Respiratory Tract                                      Infection PCT Algorithm    ----------------------------     ----------------------------         PCT < 0.25 ng/mL                PCT < 0.10 ng/mL         Strongly encourage             Strongly discourage   discontinuation of antibiotics    initiation of antibiotics    ----------------------------     -----------------------------       PCT 0.25 - 0.50 ng/mL            PCT 0.10 - 0.25 ng/mL               OR       >  80% decrease in PCT            Discourage initiation of                                            antibiotics      Encourage discontinuation           of antibiotics    ----------------------------     -----------------------------         PCT >= 0.50 ng/mL              PCT 0.26 - 0.50 ng/mL               AND        <80% decrease in PCT             Encourage initiation of                                              antibiotics       Encourage continuation           of antibiotics    ----------------------------     -----------------------------        PCT >= 0.50 ng/mL                  PCT > 0.50 ng/mL               AND         increase in PCT                  Strongly encourage                                      initiation of antibiotics    Strongly encourage escalation           of antibiotics                                     -----------------------------                                           PCT <= 0.25 ng/mL                                                 OR                                        > 80% decrease in PCT                                     Discontinue / Do not initiate  antibiotics Performed at [facility], [address] [place], [place] [address]   Lactate dehydrogenase     Status: Abnormal   Collection Time: 07/27/19  2:08 PM  Result Value Ref Range   LDH 238 (H) 98 - 192 U/L    Comment: Performed at [facility], [address] [place], [place] [address]  Ferritin     Status: Abnormal   Collection Time: 07/27/19  2:08 PM  Result Value Ref Range   Ferritin 393 (H) 11 - 307 ng/mL    Comment: Performed at [facility], [address], [place], [place] [address]  Triglycerides     Status: Abnormal   Collection Time: 07/27/19  2:08 PM  Result Value Ref Range   Triglycerides 161 (H) <150 mg/dL    Comment: Performed at [facility], [address], [place], [place] [address]  Fibrinogen     Status: Abnormal   Collection Time: 07/27/19  2:08 PM  Result Value Ref Range   Fibrinogen 641 (H) 210 - 475 mg/dL    Comment: Performed at [facility], [address] [place], [place] [address]  C-reactive protein     Status: Abnormal   Collection Time: 07/27/19  2:08 PM  Result Value Ref Range   CRP 3.0 (H) <1.0 mg/dL    Comment: Performed at [facility],  [address], [place], [place] [address]  CBG monitoring, ED     Status: Abnormal   Collection Time: 07/27/19  5:49 PM  Result Value Ref Range   Glucose-Capillary 116 (H) 70 - 99 mg/dL   Dg Chest Portable 1 View  Result Date: 07/27/2019 CLINICAL DATA:  Shortness of breath, COVID positive. EXAM: PORTABLE CHEST 1 VIEW COMPARISON:  06/20/2011 FINDINGS: Lung volumes are low. Cardiomediastinal contours are normal accounting for low lung volumes. Patchy opacities are seen bilaterally, worse at the lung bases with some peripheral predominance. Low lung volumes limit assessment. No signs of acute bone process. IMPRESSION: Low volume chest with findings that could be seen in the setting of atypical or viral pneumonia. Electronically Signed   By: Donzetta Kohut M.D.   On: 07/27/2019 14:35    Pending Labs Unresulted Labs (From admission, onward)    Start     Ordered   07/28/19 0500  CBC with Differential/Platelet  Daily,   R     07/27/19 1553   07/28/19 0500  Comprehensive metabolic panel  Daily,   R     07/27/19 1553   07/28/19 0500  C-reactive protein  Daily,   R     07/27/19 1553   07/28/19 0500  D-dimer, quantitative (not at [facility])  Daily,   R     07/27/19 1553   07/28/19 0500  Ferritin  Daily,   R     07/27/19 1553   07/28/19 0500  Magnesium  Daily,   R     07/27/19 1553   07/28/19 0500  Phosphorus  Daily,   R     07/27/19 1553   07/27/19 1553  HIV Antibody (routine testing w rflx)  (HIV Antibody (Routine testing w reflex) panel)  Once,   STAT     07/27/19 1553   07/27/19 1404  Blood Culture (routine x 2)  BLOOD CULTURE X 2,   STAT     07/27/19 1404          Vitals/Pain Today's Vitals   07/27/19 1730 07/27/19 1830 07/27/19 1900  07/27/19 1930  BP: 109/76 110/75 111/79 112/81  Pulse: 92 83 80 79  Resp: (!) 21 (!) 22 (!) 25 (!) 37  Temp:      TempSrc:      SpO2: 96% 95% 95% 94%  Weight:      Height:      PainSc:        Isolation Precautions Airborne and Contact  precautions  Medications Medications  AeroChamber Plus Flo-Vu Medium MISC 1 each (has no administration in time range)  albuterol (VENTOLIN HFA) 108 (90 Base) MCG/ACT inhaler 6 puff (has no administration in time range)  estradiol (CLIMARA - Dosed in mg/24 hr) patch 0.1 mg (0.1 mg Transdermal Patch Applied 07/27/19 1934)  HYDROcodone-homatropine (HYCODAN) 5-1.5 MG/5ML syrup 5 mL (has no administration in time range)  enoxaparin (LOVENOX) injection 40 mg (40 mg Subcutaneous Given 07/27/19 1619)  sodium chloride flush (NS) 0.9 % injection 3 mL (has no administration in time range)  sodium chloride flush (NS) 0.9 % injection 3 mL (has no administration in time range)  0.9 %  sodium chloride infusion (has no administration in time range)  acetaminophen (TYLENOL) tablet 650 mg (has no administration in time range)  oxyCODONE (Oxy IR/ROXICODONE) immediate release tablet 5 mg (has no administration in time range)  zolpidem (AMBIEN) tablet 5 mg (has no administration in time range)  docusate sodium (COLACE) capsule 100 mg (has no administration in time range)  polyethylene glycol (MIRALAX / GLYCOLAX) packet 17 g (has no administration in time range)  bisacodyl (DULCOLAX) EC tablet 5 mg (has no administration in time range)  ondansetron (ZOFRAN) tablet 4 mg (has no administration in time range)    Or  ondansetron (ZOFRAN) injection 4 mg (has no administration in time range)  sodium chloride flush (NS) 0.9 % injection 3 mL (has no administration in time range)  remdesivir 200 mg in sodium chloride 0.9 % 250 mL IVPB (0 mg Intravenous Stopped 07/27/19 1832)    Followed by  remdesivir 100 mg in sodium chloride 0.9 % 250 mL IVPB (has no administration in time range)  methylPREDNISolone sodium succinate (SOLU-MEDROL) 125 mg/2 mL injection 43.125 mg (has no administration in time range)  dexamethasone (DECADRON) injection 10 mg (10 mg Intravenous Given 07/27/19 1434)  albuterol (VENTOLIN HFA) 108 (90  Base) MCG/ACT inhaler 6 puff (6 puffs Inhalation Given 07/27/19 1435)  acetaminophen (TYLENOL) tablet 1,000 mg (1,000 mg Oral Given 07/27/19 1435)    Mobility walks Low fall risk   Focused Assessments Pulmonary Assessment Handoff:  Lung sounds: Bilateral Breath Sounds: Diminished O2 Device: Room Air O2 Flow Rate (L/min): 0 L/min      R Recommendations: See Admitting Provider Note  Report given to:   Additional Notes:

## 2019-07-27 NOTE — ED Notes (Signed)
Placed pt on 3L 02 per [place] 

## 2019-07-27 NOTE — ED Triage Notes (Signed)
Pt tested COVID + this past Saturday. Pt c/o SOBx5 days. Pt states SOB has been getting progressively worse. Pt is A&Ox4. NSR on monitor. Pt states productive cough w/phlegm, but doesn;t see it to know what color. Pt is tachypneic.

## 2019-07-27 NOTE — Progress Notes (Signed)
Pharmacy Consult - Remdesivir  11 yof presenting known COVID-19 positive with worsening SOB, requiring supplemental oxygen and evidence of lower respiratory infection on chest imaging. Pharmacy consulted to dose Remdesivir. ALT<220.   Plan: Remdesivir 200mg  IV x 1; then 100mg  IV q24h to complete 5 total doses Monitor clinical progress, LFTs   Elicia Lamp, PharmD, BCPS Please check AMION for all [place] contact numbers Clinical Pharmacist 07/23/2019 7:38 PM

## 2019-07-28 DIAGNOSIS — N809 Endometriosis, unspecified: Secondary | ICD-10-CM

## 2019-07-28 LAB — CBC WITH DIFFERENTIAL/PLATELET
Abs Immature Granulocytes: 0.04 10*3/uL (ref 0.00–0.07)
Basophils Absolute: 0 10*3/uL (ref 0.0–0.1)
Basophils Relative: 0 %
Eosinophils Absolute: 0 10*3/uL (ref 0.0–0.5)
Eosinophils Relative: 0 %
HCT: 41.7 % (ref 36.0–46.0)
Hemoglobin: 13.7 g/dL (ref 12.0–15.0)
Immature Granulocytes: 1 %
Lymphocytes Relative: 9 %
Lymphs Abs: 0.7 10*3/uL (ref 0.7–4.0)
MCH: 30 pg (ref 26.0–34.0)
MCHC: 32.9 g/dL (ref 30.0–36.0)
MCV: 91.2 fL (ref 80.0–100.0)
Monocytes Absolute: 0.6 10*3/uL (ref 0.1–1.0)
Monocytes Relative: 8 %
Neutro Abs: 6.8 10*3/uL (ref 1.7–7.7)
Neutrophils Relative %: 82 %
Platelets: 242 10*3/uL (ref 150–400)
RBC: 4.57 MIL/uL (ref 3.87–5.11)
RDW: 11.7 % (ref 11.5–15.5)
WBC: 8.4 10*3/uL (ref 4.0–10.5)
nRBC: 0 % (ref 0.0–0.2)

## 2019-07-28 LAB — COMPREHENSIVE METABOLIC PANEL
ALT: 32 U/L (ref 0–44)
AST: 31 U/L (ref 15–41)
Albumin: 3.2 g/dL — ABNORMAL LOW (ref 3.5–5.0)
Alkaline Phosphatase: 41 U/L (ref 38–126)
Anion gap: 13 (ref 5–15)
BUN: 16 mg/dL (ref 6–20)
CO2: 26 mmol/L (ref 22–32)
Calcium: 9 mg/dL (ref 8.9–10.3)
Chloride: 98 mmol/L (ref 98–111)
Creatinine, Ser: 0.94 mg/dL (ref 0.44–1.00)
GFR calc Af Amer: >60 mL/min (ref >60)
GFR calc non Af Amer: >60 mL/min (ref >60)
Glucose, Bld: 135 mg/dL — ABNORMAL HIGH (ref 70–99)
Potassium: 5 mmol/L (ref 3.5–5.1)
Sodium: 137 mmol/L (ref 135–145)
Total Bilirubin: 0.6 mg/dL (ref 0.3–1.2)
Total Protein: 7.7 g/dL (ref 6.5–8.1)

## 2019-07-28 LAB — FERRITIN: Ferritin: 387 ng/mL — ABNORMAL HIGH (ref 11–307)

## 2019-07-28 LAB — PHOSPHORUS: Phosphorus: 3.9 mg/dL (ref 2.5–4.6)

## 2019-07-28 LAB — C-REACTIVE PROTEIN: CRP: 5.4 mg/dL — ABNORMAL HIGH (ref <1.0)

## 2019-07-28 LAB — HIV ANTIBODY (ROUTINE TESTING W REFLEX): HIV Screen 4th Generation wRfx: NONREACTIVE

## 2019-07-28 LAB — D-DIMER, QUANTITATIVE: D-Dimer, Quant: 0.52 ug/mL-FEU — ABNORMAL HIGH (ref 0.00–0.50)

## 2019-07-28 LAB — MAGNESIUM: Magnesium: 2.3 mg/dL (ref 1.7–2.4)

## 2019-07-28 NOTE — Progress Notes (Signed)
[facility]  PROGRESS NOTE  Brenda Chambers MMN:[id number] DOB: [birth year] DOA: 07/27/2019 PCP: Patient, No Pcp Per   Brief HPI:   48 year old female with history of hyperlipidemia, endometriosis presents with worsening COVID-19 infection.  Patient says that she tested positive for COVID-19 on 07/22/2019.  She developed symptoms on  11/16 with fever, abdominal flank and back pain, myalgias and dry cough, after her friend had visited her home.  Patient had been quarantined with shortness of breath and cough.  For past 24 hours patient became worse and came to ED.  She was hypoxic with O2 sats in 80s.  Chest x-ray showed low lung volume chest with findings typically seen in setting of atypical or viral pneumonia.  Patient was started on remdesivir and Decadron.    Subjective   This morning patient denies worsening of symptoms, but says that she still feels the same.   Assessment/Plan:     1. Acute hypoxic respiratory failure-patient presented with worsening shortness of breath, fever in the setting of COVID-19 infection tested positive on 07/22/2019.  Started on remdesivir and Decadron.  CRP is 5.4 today, D-dimer 0.52, ferritin 387.  Follow inflammatory markers in a.m.  2. Endometriosis-continue estradiol    CBG: Recent Labs  Lab 07/27/19 1749  GLUCAP 116*    CBC: Recent Labs  Lab 07/27/19 1408 07/28/19 0524  WBC 5.9 8.4  NEUTROABS 4.5 6.8  HGB 13.8 13.7  HCT 42.8 41.7  MCV 93.2 91.2  PLT 217 242    Basic Metabolic Panel: Recent Labs  Lab 07/27/19 1408 07/28/19 0524  NA 135 137  K 4.4 5.0  CL 99 98  CO2 24 26  GLUCOSE 89 135*  BUN 12 16  CREATININE 1.09* 0.94  CALCIUM 8.7* 9.0  MG  --  2.3  PHOS  --  3.9    COVID-19 Labs  Recent Labs    07/27/19 1408 07/28/19 0524  DDIMER 0.60* 0.52*  FERRITIN 393* 387*  LDH 238*  --   CRP 3.0* 5.4*    No results found for: SARSCOV2NAA   DVT prophylaxis: Lovenox  Code Status: Full code  Family  Communication: No family at bedside  Disposition Plan: likely home when medically ready for discharge        BMI  Estimated body mass index is 30.81 kg/m as calculated from the following:   Height as of this encounter: 5\' 6"  (1.676 m).   Weight as of this encounter: 86.6 kg.  Scheduled medications:  . AeroChamber Plus Flo-Vu Medium  1 each Other Once  . docusate sodium  100 mg Oral BID  . enoxaparin (LOVENOX) injection  40 mg Subcutaneous Q24H  . estradiol  0.1 mg Transdermal Weekly  . influenza vac split quadrivalent PF  0.5 mL Intramuscular Tomorrow-1000  . methylPREDNISolone (SOLU-MEDROL) injection  0.5 mg/kg Intravenous Q12H  . sodium chloride flush  3 mL Intravenous Q12H  . sodium chloride flush  3 mL Intravenous Q12H    Consultants:    Procedures:     Antibiotics:   Anti-infectives (From admission, onward)   Start     Dose/Rate Route Frequency Ordered Stop   07/28/19 1603  remdesivir 100 mg in sodium chloride 0.9 % 250 mL IVPB     100 mg 500 mL/hr over 30 Minutes Intravenous Every 24 hours 07/27/19 1603 08/01/19 1614   07/27/19 1615  remdesivir 200 mg in sodium chloride 0.9 % 250 mL IVPB     200 mg 500 mL/hr over 30 Minutes  Intravenous Once 07/27/19 1603 07/27/19 1832       Objective   Vitals:   07/27/19 2100 07/27/19 2203 07/28/19 0426 07/28/19 0829  BP: 112/85 112/88 122/84 111/79  Pulse: 76 80 86 89  Resp: (!) 25 (!) 22  16  Temp:  98.9 F (37.2 C) 99.5 F (37.5 C) 99.3 F (37.4 C)  TempSrc:  Oral Oral   SpO2: 95% 95% (!) 89% 92%  Weight:  86.6 kg    Height:        Intake/Output Summary (Last 24 hours) at 07/28/2019 1513 Last data filed at 07/27/2019 1832 Gross per 24 hour  Intake 250 ml  Output -  Net 250 ml   Filed Weights   07/27/19 1343 07/27/19 2203  Weight: 86.2 kg 86.6 kg     Physical Examination:    General: Appears in no acute distress  Cardiovascular: S1-S2, regular, no murmur auscultated  Respiratory:  Bilateral rhonchi auscultated  Abdomen: Abdomen is soft, nontender, no organomegaly  Extremities: No edema in the lower extremities  Neurologic: Cranial nerves II through XII grossly intact, no focal deficit noted     Data Reviewed: I have personally reviewed following labs and imaging studies   Recent Results (from the past 240 hour(s))  Blood Culture (routine x 2)     Status: None (Preliminary result)   Collection Time: 07/27/19  2:03 PM   Specimen: BLOOD  Result Value Ref Range Status   Specimen Description BLOOD LEFT ANTECUBITAL  Final   Special Requests   Final    BOTTLES DRAWN AEROBIC AND ANAEROBIC Blood Culture adequate volume   Culture   Final    NO GROWTH < 24 HOURS Performed at [facility], [address] [place], [place] [address]    Report Status PENDING  Incomplete  Blood Culture (routine x 2)     Status: None (Preliminary result)   Collection Time: 07/27/19  2:17 PM   Specimen: BLOOD  Result Value Ref Range Status   Specimen Description BLOOD RIGHT ANTECUBITAL  Final   Special Requests   Final    BOTTLES DRAWN AEROBIC AND ANAEROBIC Blood Culture results may not be optimal due to an inadequate volume of blood received in culture bottles   Culture   Final    NO GROWTH < 24 HOURS Performed at [facility], [address] [place], [place] [address]    Report Status PENDING  Incomplete     Liver Function Tests: Recent Labs  Lab 07/27/19 1408 07/28/19 0524  AST 31 31  ALT 27 32  ALKPHOS 37* 41  BILITOT 0.6 0.6  PROT 7.5 7.7  ALBUMIN 3.2* 3.2*      Studies: Dg Chest Portable 1 View  Result Date: 07/27/2019 CLINICAL DATA:  Shortness of breath, COVID positive. EXAM: PORTABLE CHEST 1 VIEW COMPARISON:  06/20/2011 FINDINGS: Lung volumes are low. Cardiomediastinal contours are normal accounting for low lung volumes. Patchy opacities are seen bilaterally, worse at the lung bases with some peripheral predominance. Low lung volumes limit  assessment. No signs of acute bone process. IMPRESSION: Low volume chest with findings that could be seen in the setting of atypical or viral pneumonia. Electronically Signed   By: Donzetta KohutGeoffrey  Wile M.D.   On: 07/27/2019 14:35     Admission status: Inpatient: Based on patients clinical presentation and evaluation of above clinical data, I have made determination that patient meets Inpatient criteria at this time.   Meredeth IdeGagan S Ricardo Kayes   [facility] Pager [phone]. If 7PM-7AM, please contact night-coverage at [email or web address], Office  [phone]  password [email or web address]  07/28/2019, 3:13 PM  LOS: 1 day

## 2019-07-29 LAB — COMPREHENSIVE METABOLIC PANEL
ALT: 31 U/L (ref 0–44)
AST: 32 U/L (ref 15–41)
Albumin: 2.9 g/dL — ABNORMAL LOW (ref 3.5–5.0)
Alkaline Phosphatase: 35 U/L — ABNORMAL LOW (ref 38–126)
Anion gap: 12 (ref 5–15)
BUN: 17 mg/dL (ref 6–20)
CO2: 25 mmol/L (ref 22–32)
Calcium: 8.8 mg/dL — ABNORMAL LOW (ref 8.9–10.3)
Chloride: 101 mmol/L (ref 98–111)
Creatinine, Ser: 0.89 mg/dL (ref 0.44–1.00)
GFR calc Af Amer: >60 mL/min (ref >60)
GFR calc non Af Amer: >60 mL/min (ref >60)
Glucose, Bld: 129 mg/dL — ABNORMAL HIGH (ref 70–99)
Potassium: 5 mmol/L (ref 3.5–5.1)
Sodium: 138 mmol/L (ref 135–145)
Total Bilirubin: 0.8 mg/dL (ref 0.3–1.2)
Total Protein: 7.4 g/dL (ref 6.5–8.1)

## 2019-07-29 LAB — CBC WITH DIFFERENTIAL/PLATELET
Abs Immature Granulocytes: 0.06 10*3/uL (ref 0.00–0.07)
Basophils Absolute: 0 10*3/uL (ref 0.0–0.1)
Basophils Relative: 0 %
Eosinophils Absolute: 0 10*3/uL (ref 0.0–0.5)
Eosinophils Relative: 0 %
HCT: 42.3 % (ref 36.0–46.0)
Hemoglobin: 13.9 g/dL (ref 12.0–15.0)
Immature Granulocytes: 1 %
Lymphocytes Relative: 19 %
Lymphs Abs: 1.2 10*3/uL (ref 0.7–4.0)
MCH: 30 pg (ref 26.0–34.0)
MCHC: 32.9 g/dL (ref 30.0–36.0)
MCV: 91.2 fL (ref 80.0–100.0)
Monocytes Absolute: 0.5 10*3/uL (ref 0.1–1.0)
Monocytes Relative: 7 %
Neutro Abs: 4.4 10*3/uL (ref 1.7–7.7)
Neutrophils Relative %: 73 %
Platelets: 251 10*3/uL (ref 150–400)
RBC: 4.64 MIL/uL (ref 3.87–5.11)
RDW: 11.6 % (ref 11.5–15.5)
WBC: 6.1 10*3/uL (ref 4.0–10.5)
nRBC: 0 % (ref 0.0–0.2)

## 2019-07-29 LAB — C-REACTIVE PROTEIN: CRP: 3.1 mg/dL — ABNORMAL HIGH (ref <1.0)

## 2019-07-29 LAB — MAGNESIUM: Magnesium: 2.3 mg/dL (ref 1.7–2.4)

## 2019-07-29 LAB — D-DIMER, QUANTITATIVE: D-Dimer, Quant: 0.48 ug/mL-FEU (ref 0.00–0.50)

## 2019-07-29 LAB — FERRITIN: Ferritin: 506 ng/mL — ABNORMAL HIGH (ref 11–307)

## 2019-07-29 LAB — PHOSPHORUS: Phosphorus: 4.2 mg/dL (ref 2.5–4.6)

## 2019-07-29 LAB — BRAIN NATRIURETIC PEPTIDE: B Natriuretic Peptide: 17.1 pg/mL (ref 0.0–100.0)

## 2019-07-29 MED ORDER — FUROSEMIDE 10 MG/ML IJ SOLN
20.0000 mg | Freq: Two times a day (BID) | INTRAMUSCULAR | Status: DC
  Administered 2019-07-29 – 2019-07-30 (×2): 20 mg via INTRAVENOUS
  Filled 2019-07-29 (×2): qty 2

## 2019-07-29 MED ORDER — FUROSEMIDE 10 MG/ML IJ SOLN
20.0000 mg | Freq: Once | INTRAMUSCULAR | Status: AC
  Administered 2019-07-29: 20 mg via INTRAVENOUS
  Filled 2019-07-29: qty 2

## 2019-07-29 MED ORDER — DEXAMETHASONE 4 MG PO TABS
6.0000 mg | ORAL_TABLET | Freq: Every day | ORAL | Status: DC
  Administered 2019-07-29 – 2019-07-30 (×2): 6 mg via ORAL
  Filled 2019-07-29 (×2): qty 2

## 2019-07-29 NOTE — Progress Notes (Signed)
[facility]  PROGRESS NOTE  Brenda Chambers ZDG:[id number] DOB: 1970-10-21 DOA: 07/27/2019 PCP: Patient, No Pcp Per   Brief HPI:   48 year old female with history of hyperlipidemia, endometriosis presents with worsening COVID-19 infection.  Patient says that she tested positive for COVID-19 on 07/22/2019.  She developed symptoms on  11/16 with fever, abdominal flank and back pain, myalgias and dry cough, after her friend had visited her home.  Patient had been quarantined with shortness of breath and cough.  For past 24 hours patient became worse and came to ED.  She was hypoxic with O2 sats in 80s.  Chest x-ray showed low lung volume chest with findings typically seen in setting of atypical or viral pneumonia.  Patient was started on remdesivir and Decadron.    Subjective   Patient seen and examined, feels anxious that she is not improving.  However inflammatory markers are improved, she is only requiring 3 L/min of oxygen.  Oxygen requirement has not gone up.   Assessment/Plan:     1. Acute hypoxic respiratory failure-patient presented with worsening shortness of breath, fever in the setting of COVID-19 infection tested positive on 07/22/2019.  Started on remdesivir and Decadron.  CRP is down to 3.1 today, D-dimer 0.48, ferritin 506.  Follow inflammatory markers in a.m. She has bibasilar crackles,  start Lasix 20 mg IV every 12 hour.  2. Endometriosis-continue estradiol    CBG: Recent Labs  Lab 07/27/19 1749  GLUCAP 116*    CBC: Recent Labs  Lab 07/27/19 1408 07/28/19 0524 07/29/19 0447  WBC 5.9 8.4 6.1  NEUTROABS 4.5 6.8 4.4  HGB 13.8 13.7 13.9  HCT 42.8 41.7 42.3  MCV 93.2 91.2 91.2  PLT 217 242 251    Basic Metabolic Panel: Recent Labs  Lab 07/27/19 1408 07/28/19 0524 07/29/19 0447  NA 135 137 138  K 4.4 5.0 5.0  CL 99 98 101  CO2 24 26 25   GLUCOSE 89 135* 129*  BUN 12 16 17   CREATININE 1.09* 0.94 0.89  CALCIUM 8.7* 9.0 8.8*  MG  --  2.3 2.3   PHOS  --  3.9 4.2    COVID-19 Labs  Recent Labs    07/27/19 1408 07/28/19 0524 07/29/19 0447  DDIMER 0.60* 0.52* 0.48  FERRITIN 393* 387* 506*  LDH 238*  --   --   CRP 3.0* 5.4* 3.1*    No results found for: SARSCOV2NAA   DVT prophylaxis: Lovenox  Code Status: Full code  Family Communication: No family at bedside  Disposition Plan: likely home when medically ready for discharge        BMI  Estimated body mass index is 30.81 kg/m as calculated from the following:   Height as of this encounter: 5\' 6"  (1.676 m).   Weight as of this encounter: 86.6 kg.  Scheduled medications:  . AeroChamber Plus Flo-Vu Medium  1 each Other Once  . dexamethasone  6 mg Oral Daily  . docusate sodium  100 mg Oral BID  . enoxaparin (LOVENOX) injection  40 mg Subcutaneous Q24H  . estradiol  0.1 mg Transdermal Weekly  . furosemide  20 mg Intravenous Q12H  . influenza vac split quadrivalent PF  0.5 mL Intramuscular Tomorrow-1000  . sodium chloride flush  3 mL Intravenous Q12H  . sodium chloride flush  3 mL Intravenous Q12H    Consultants:    Procedures:     Antibiotics:   Anti-infectives (From admission, onward)   Start     Dose/Rate  Route Frequency Ordered Stop   07/28/19 1603  remdesivir 100 mg in sodium chloride 0.9 % 250 mL IVPB     100 mg 500 mL/hr over 30 Minutes Intravenous Every 24 hours 07/27/19 1603 08/01/19 1614   07/27/19 1615  remdesivir 200 mg in sodium chloride 0.9 % 250 mL IVPB     200 mg 500 mL/hr over 30 Minutes Intravenous Once 07/27/19 1603 07/27/19 1832       Objective   Vitals:   07/28/19 2014 07/29/19 0010 07/29/19 0739 07/29/19 1000  BP: 109/75 111/78 112/86   Pulse: 75 67 74   Resp: 18  (!) 24   Temp: 99.3 F (37.4 C) 98.2 F (36.8 C) 97.9 F (36.6 C)   TempSrc: Oral Oral Oral   SpO2: 96% 92% (!) 87% 94%  Weight:      Height:        Intake/Output Summary (Last 24 hours) at 07/29/2019 1326 Last data filed at 07/29/2019  0900 Gross per 24 hour  Intake 1090 ml  Output -  Net 1090 ml   Filed Weights   07/27/19 1343 07/27/19 2203  Weight: 86.2 kg 86.6 kg     Physical Examination:  General-appears in no acute distress Heart-S1-S2, regular, no murmur auscultated Lungs-bibasilar crackles Abdomen-soft, nontender, no organomegaly Extremities-no edema in the lower extremities Neuro-alert, oriented x3, no focal deficit noted    Data Reviewed: I have personally reviewed following labs and imaging studies   Recent Results (from the past 240 hour(s))  Blood Culture (routine x 2)     Status: None (Preliminary result)   Collection Time: 07/27/19  2:03 PM   Specimen: BLOOD  Result Value Ref Range Status   Specimen Description BLOOD LEFT ANTECUBITAL  Final   Special Requests   Final    BOTTLES DRAWN AEROBIC AND ANAEROBIC Blood Culture adequate volume   Culture   Final    NO GROWTH 2 DAYS Performed at [facility], [place] [address], [place], [place] [address]    Report Status PENDING  Incomplete  Blood Culture (routine x 2)     Status: None (Preliminary result)   Collection Time: 07/27/19  2:17 PM   Specimen: BLOOD  Result Value Ref Range Status   Specimen Description BLOOD RIGHT ANTECUBITAL  Final   Special Requests   Final    BOTTLES DRAWN AEROBIC AND ANAEROBIC Blood Culture results may not be optimal due to an inadequate volume of blood received in culture bottles   Culture   Final    NO GROWTH 2 DAYS Performed at [facility], [place] [address] [place], [place] [address]    Report Status PENDING  Incomplete     Liver Function Tests: Recent Labs  Lab 07/27/19 1408 07/28/19 0524 07/29/19 0447  AST 31 31 32  ALT 27 32 31  ALKPHOS 37* 41 35*  BILITOT 0.6 0.6 0.8  PROT 7.5 7.7 7.4  ALBUMIN 3.2* 3.2* 2.9*      Studies: Dg Chest Portable 1 View  Result Date: 07/27/2019 CLINICAL DATA:  Shortness of breath, COVID positive. EXAM: PORTABLE CHEST 1 VIEW COMPARISON:  06/20/2011  FINDINGS: Lung volumes are low. Cardiomediastinal contours are normal accounting for low lung volumes. Patchy opacities are seen bilaterally, worse at the lung bases with some peripheral predominance. Low lung volumes limit assessment. No signs of acute bone process. IMPRESSION: Low volume chest with findings that could be seen in the setting of atypical or viral pneumonia. Electronically Signed  By: Donzetta Kohut M.D.   On: 07/27/2019 14:35     Admission status: Inpatient: Based on patients clinical presentation and evaluation of above clinical data, I have made determination that patient meets Inpatient criteria at this time.   Meredeth Ide   [facility] Pager [phone]. If 7PM-7AM, please contact night-coverage at [email or web address], Office  [phone]  password [email or web address]  07/29/2019, 1:26 PM  LOS: 2 days

## 2019-07-30 ENCOUNTER — Inpatient Hospital Stay (HOSPITAL_COMMUNITY): Payer: BC Managed Care – PPO

## 2019-07-30 LAB — FERRITIN: Ferritin: 726 ng/mL — ABNORMAL HIGH (ref 11–307)

## 2019-07-30 LAB — CBC WITH DIFFERENTIAL/PLATELET
Abs Immature Granulocytes: 0.07 10*3/uL (ref 0.00–0.07)
Basophils Absolute: 0 10*3/uL (ref 0.0–0.1)
Basophils Relative: 0 %
Eosinophils Absolute: 0 10*3/uL (ref 0.0–0.5)
Eosinophils Relative: 0 %
HCT: 43.4 % (ref 36.0–46.0)
Hemoglobin: 14.3 g/dL (ref 12.0–15.0)
Immature Granulocytes: 1 %
Lymphocytes Relative: 19 %
Lymphs Abs: 1.4 10*3/uL (ref 0.7–4.0)
MCH: 29.8 pg (ref 26.0–34.0)
MCHC: 32.9 g/dL (ref 30.0–36.0)
MCV: 90.4 fL (ref 80.0–100.0)
Monocytes Absolute: 0.7 10*3/uL (ref 0.1–1.0)
Monocytes Relative: 9 %
Neutro Abs: 5.1 10*3/uL (ref 1.7–7.7)
Neutrophils Relative %: 71 %
Platelets: 343 10*3/uL (ref 150–400)
RBC: 4.8 MIL/uL (ref 3.87–5.11)
RDW: 11.3 % — ABNORMAL LOW (ref 11.5–15.5)
WBC: 7.2 10*3/uL (ref 4.0–10.5)
nRBC: 0 % (ref 0.0–0.2)

## 2019-07-30 LAB — MAGNESIUM: Magnesium: 2.2 mg/dL (ref 1.7–2.4)

## 2019-07-30 LAB — BLOOD GAS, ARTERIAL
Acid-Base Excess: 3.3 mmol/L — ABNORMAL HIGH (ref 0.0–2.0)
Bicarbonate: 27.1 mmol/L (ref 20.0–28.0)
Drawn by: 441371
FIO2: 40
O2 Saturation: 88.6 %
Patient temperature: 36.7
pCO2 arterial: 38.7 mmHg (ref 32.0–48.0)
pH, Arterial: 7.458 — ABNORMAL HIGH (ref 7.350–7.450)
pO2, Arterial: 56.1 mmHg — ABNORMAL LOW (ref 83.0–108.0)

## 2019-07-30 LAB — COMPREHENSIVE METABOLIC PANEL
ALT: 110 U/L — ABNORMAL HIGH (ref 0–44)
AST: 74 U/L — ABNORMAL HIGH (ref 15–41)
Albumin: 2.9 g/dL — ABNORMAL LOW (ref 3.5–5.0)
Alkaline Phosphatase: 36 U/L — ABNORMAL LOW (ref 38–126)
Anion gap: 12 (ref 5–15)
BUN: 22 mg/dL — ABNORMAL HIGH (ref 6–20)
CO2: 24 mmol/L (ref 22–32)
Calcium: 9 mg/dL (ref 8.9–10.3)
Chloride: 103 mmol/L (ref 98–111)
Creatinine, Ser: 0.89 mg/dL (ref 0.44–1.00)
GFR calc Af Amer: >60 mL/min (ref >60)
GFR calc non Af Amer: >60 mL/min (ref >60)
Glucose, Bld: 111 mg/dL — ABNORMAL HIGH (ref 70–99)
Potassium: 4 mmol/L (ref 3.5–5.1)
Sodium: 139 mmol/L (ref 135–145)
Total Bilirubin: 0.8 mg/dL (ref 0.3–1.2)
Total Protein: 7.4 g/dL (ref 6.5–8.1)

## 2019-07-30 LAB — D-DIMER, QUANTITATIVE: D-Dimer, Quant: 0.45 ug/mL-FEU (ref 0.00–0.50)

## 2019-07-30 LAB — C-REACTIVE PROTEIN: CRP: 1.1 mg/dL — ABNORMAL HIGH (ref <1.0)

## 2019-07-30 LAB — ABO/RH: ABO/RH(D): B POS

## 2019-07-30 LAB — PHOSPHORUS: Phosphorus: 4 mg/dL (ref 2.5–4.6)

## 2019-07-30 MED ORDER — DEXAMETHASONE 4 MG PO TABS: 6.0000 mg | ORAL_TABLET | Freq: Two times a day (BID) | ORAL | Status: DC

## 2019-07-30 MED ORDER — ALBUTEROL SULFATE HFA 108 (90 BASE) MCG/ACT IN AERS
2.0000 | INHALATION_SPRAY | RESPIRATORY_TRACT | Status: DC
  Administered 2019-07-30 – 2019-08-02 (×18): 2 via RESPIRATORY_TRACT
  Filled 2019-07-30: qty 6.7

## 2019-07-30 MED ORDER — VITAMIN C 500 MG PO TABS
500.0000 mg | ORAL_TABLET | Freq: Every day | ORAL | Status: DC
  Administered 2019-07-30 – 2019-08-02 (×4): 500 mg via ORAL
  Filled 2019-07-30 (×4): qty 1

## 2019-07-30 MED ORDER — FUROSEMIDE 10 MG/ML IJ SOLN: 20.0000 mg | Freq: Once | INTRAMUSCULAR | Status: DC

## 2019-07-30 MED ORDER — SODIUM CHLORIDE 0.9% IV SOLUTION: Freq: Once | INTRAVENOUS | Status: DC

## 2019-07-30 MED ORDER — DEXAMETHASONE SODIUM PHOSPHATE 10 MG/ML IJ SOLN
6.0000 mg | Freq: Two times a day (BID) | INTRAMUSCULAR | Status: DC
  Administered 2019-07-30 – 2019-08-01 (×4): 6 mg via INTRAVENOUS
  Filled 2019-07-30 (×5): qty 1

## 2019-07-30 MED ORDER — ZINC SULFATE 220 (50 ZN) MG PO CAPS
220.0000 mg | ORAL_CAPSULE | Freq: Every day | ORAL | Status: DC
  Administered 2019-07-30 – 2019-08-02 (×4): 220 mg via ORAL
  Filled 2019-07-30 (×4): qty 1

## 2019-07-30 NOTE — Progress Notes (Addendum)
[facility]  PROGRESS NOTE  Brenda Chambers MIW:[id number] DOB: 01-25-1971 DOA: 07/27/2019 PCP: Patient, No Pcp Per   Brief HPI:   48 year old female with history of hyperlipidemia, endometriosis presents with worsening COVID-19 infection.  Patient says that she tested positive for COVID-19 on 07/22/2019.  She developed symptoms on  11/16 with fever, abdominal flank and back pain, myalgias and dry cough, after her friend had visited her home.  Patient had been quarantined with shortness of breath and cough.  For past 24 hours patient became worse and came to ED.  She was hypoxic with O2 sats in 80s.  Chest x-ray showed low lung volume chest with findings typically seen in setting of atypical or viral pneumonia.  Patient was started on remdesivir and Decadron.    Subjective   Patient seen and examined, oxygen requirement has gone up.  Little confused this morning.  Wants to go home.  O2 sats ranging from 89 to 93% on 4 L of oxygen.   Assessment/Plan:     1. Acute hypoxic respiratory failure-patient presented with worsening shortness of breath, fever in the setting of COVID-19 infection tested positive on 07/22/2019.  Started on remdesivir and Decadron.  CRP and ferritin is currently pending, D-dimer is 0.45.  Follow inflammatory markers today. She was started on IV Lasix with good diuretic response.  Will repeat chest x-ray today.  Will increase oxygen to 5 L/min.  Patient will be transferred to [facility] for further management.  Started on zinc sulfate 220 mg daily, vitamin C 500 mg daily.  Will change albuterol inhaler to 2 puffs every 4 hours scheduled.  2. Transaminitis-labs this morning showed AST 74, ALT 110, alk phos 36.  Likely from underlying viral illness as above.  Follow LFTs in a.m.  3. Endometriosis-continue estradiol    CBG: Recent Labs  Lab 07/27/19 1749  GLUCAP 116*    CBC: Recent Labs  Lab 07/27/19 1408 07/28/19 0524 07/29/19 0447 07/30/19 0803  WBC 5.9  8.4 6.1 7.2  NEUTROABS 4.5 6.8 4.4 5.1  HGB 13.8 13.7 13.9 14.3  HCT 42.8 41.7 42.3 43.4  MCV 93.2 91.2 91.2 90.4  PLT 217 242 251 250    Basic Metabolic Panel: Recent Labs  Lab 07/27/19 1408 07/28/19 0524 07/29/19 0447 07/30/19 0803  NA 135 137 138 139  K 4.4 5.0 5.0 4.0  CL 99 98 101 103  CO2 _0 GLUCOSE 89 135* 129* 111*  BUN _1 22*  CREATININE 1.09* 0.94 0.89 0.89  CALCIUM 8.7* 9.0 8.8* 9.0  MG  --  2.3 2.3 2.2  PHOS  --  3.9 4.2 4.0    COVID-19 Labs  Recent Labs    07/27/19 1408 07/28/19 0524 07/29/19 0447 07/30/19 0803  DDIMER 0.60* 0.52* 0.48 0.45  FERRITIN 393* 387* 506*  --   LDH 238*  --   --   --   CRP 3.0* 5.4* 3.1*  --     No results found for: SARSCOV2NAA   DVT prophylaxis: Lovenox  Code Status: Full code  Family Communication: No family at bedside  Disposition Plan: likely home when medically ready for discharge     BMI  Estimated body mass index is 30.81 kg/m as calculated from the following:   Height as of this encounter: _2  (1.676 m).   Weight as of this encounter: 86.6 kg.  Scheduled medications:  . AeroChamber Plus Flo-Vu Medium  1 each Other Once  . albuterol  2 puff Inhalation Q4H  . dexamethasone  6 mg Oral Daily  . docusate sodium  100 mg Oral BID  . enoxaparin (LOVENOX) injection  40 mg Subcutaneous Q24H  . estradiol  0.1 mg Transdermal Weekly  . influenza vac split quadrivalent PF  0.5 mL Intramuscular Tomorrow-1000  . sodium chloride flush  3 mL Intravenous Q12H  . sodium chloride flush  3 mL Intravenous Q12H  . vitamin C  500 mg Oral Daily  . zinc sulfate  220 mg Oral Daily    Consultants:    Procedures:     Antibiotics:   Anti-infectives (From admission, onward)   Start     Dose/Rate Route Frequency Ordered Stop   07/28/19 1603  remdesivir 100 mg in sodium chloride 0.9 % 250 mL IVPB     100 mg 500 mL/hr over 30 Minutes Intravenous Every 24 hours 07/27/19 1603 08/01/19 1614    07/27/19 1615  remdesivir 200 mg in sodium chloride 0.9 % 250 mL IVPB     200 mg 500 mL/hr over 30 Minutes Intravenous Once 07/27/19 1603 07/27/19 1832       Objective   Vitals:   07/29/19 1800 07/29/19 2216 07/30/19 0411 07/30/19 0810  BP:  108/84 96/66 101/65  Pulse:  76 65 71  Resp:  18 20   Temp:  98.6 F (37 C) 98.4 F (36.9 C) 98.1 F (36.7 C)  TempSrc:  Oral Oral Oral  SpO2: 93% 90% 93% 90%  Weight:      Height:       No intake or output data in the 24 hours ending 07/30/19 0905 Filed Weights   07/27/19 1343 07/27/19 2203  Weight: 86.2 kg 86.6 kg     Physical Examination:  General-appears  mildly confused Heart-S1-S2, regular, no murmur auscultated Lungs-decreased breath sounds bilaterally Abdomen-soft, nontender, no organomegaly Extremities-no edema in the lower extremities Neuro-alert, oriented x3, no focal deficit noted   Data Reviewed: I have personally reviewed following labs and imaging studies   Recent Results (from the past 240 hour(s))  Blood Culture (routine x 2)     Status: None (Preliminary result)   Collection Time: 07/27/19  2:03 PM   Specimen: BLOOD  Result Value Ref Range Status   Specimen Description BLOOD LEFT ANTECUBITAL  Final   Special Requests   Final    BOTTLES DRAWN AEROBIC AND ANAEROBIC Blood Culture adequate volume   Culture   Final    NO GROWTH 2 DAYS Performed at [facility], [place] [address] [place], [place] [address]    Report Status PENDING  Incomplete  Blood Culture (routine x 2)     Status: None (Preliminary result)   Collection Time: 07/27/19  2:17 PM   Specimen: BLOOD  Result Value Ref Range Status   Specimen Description BLOOD RIGHT ANTECUBITAL  Final   Special Requests   Final    BOTTLES DRAWN AEROBIC AND ANAEROBIC Blood Culture results may not be optimal due to an inadequate volume of blood received in culture bottles   Culture   Final    NO GROWTH 2 DAYS Performed at [facility], [place]  [address], [place], [place] [address]    Report Status PENDING  Incomplete     Liver Function Tests: Recent Labs  Lab 07/27/19 1408 07/28/19 0524 07/29/19 0447 07/30/19 0803  AST 31 31 32 74*  ALT 27 32 31 110*  ALKPHOS 37* 41 35* 36*  BILITOT 0.6 0.6 0.8 0.8  PROT 7.5 7.7 7.4 7.4  ALBUMIN 3.2* 3.2* 2.9* 2.9*      Studies: No results found.   Admission status: Inpatient: Based on patients clinical presentation and evaluation of above clinical data, I have made determination that patient meets Inpatient criteria at this time.   [facility] Pager [phone]. If 7PM-7AM, please contact night-coverage at [email or web address], Office  [phone]  password [place]  07/30/2019, 9:05 AM  LOS: 3 days

## 2019-07-30 NOTE — Plan of Care (Signed)

## 2019-07-30 NOTE — Progress Notes (Signed)
Patient got transferred to [place].  Had discussed with Dr. Darrick Meigs regarding this patient.  Patient was seen and examined.  Notes from this morning reviewed.  Chest x-ray from this morning reviewed.  Patient currently on 8 L of oxygen by high flow nasal cannula.  She states that she is having some difficulty breathing.  Cough with whitish expectoration.  Occasional chest pain when she takes a deep breath.  Other vital signs are stable.  Lungs do reveal crackles bilaterally at the bases.  No wheezing or rhonchi.  CRP is 1.1 this morning.  BNP was 17.1 yesterday.  Minimal elevation in LFTs noted.  WBC is normal.  D-dimer 0.45.  Patient is already receiving remdesivir and steroids.  Steroid dose was increased this morning.  Patient might benefit from convalescent plasma.  CRP is too low for [place] to consider and offer Actemra at this time.  Patient was given informed consent forms for the Covid convalescent plasma research study.  She read through the forms.  She was given an opportunity to ask questions.  She did not have any questions.  She signed the form.  I signed it subsequently.  Hardcopy is in the shadow chart.  Covid convalescent plasma has been ordered.  Patient told to be compliant with incentive spirometry.  Told also about prone position.  Out of bed to chair whenever possible.  We will continue to monitor closely.  Bonnielee Haff 4:05 PM

## 2019-07-30 NOTE — Progress Notes (Signed)
Offered to call and update patient's spouse, she said that the doctor had called her wife earlier and she has been in contact with her today as well, since there is nothing new to report she said a phone call was not necessary this evening, she was thankful for the offer.

## 2019-07-31 DIAGNOSIS — J1282 Pneumonia due to coronavirus disease 2019: Secondary | ICD-10-CM

## 2019-07-31 DIAGNOSIS — U071 COVID-19: Secondary | ICD-10-CM

## 2019-07-31 DIAGNOSIS — F19959 Other psychoactive substance use, unspecified with psychoactive substance-induced psychotic disorder, unspecified: Secondary | ICD-10-CM

## 2019-07-31 DIAGNOSIS — J9601 Acute respiratory failure with hypoxia: Secondary | ICD-10-CM

## 2019-07-31 DIAGNOSIS — J1289 Other viral pneumonia: Secondary | ICD-10-CM

## 2019-07-31 LAB — CBC WITH DIFFERENTIAL/PLATELET
Abs Immature Granulocytes: 0.07 10*3/uL (ref 0.00–0.07)
Basophils Absolute: 0 10*3/uL (ref 0.0–0.1)
Basophils Relative: 0 %
Eosinophils Absolute: 0 10*3/uL (ref 0.0–0.5)
Eosinophils Relative: 0 %
HCT: 41.5 % (ref 36.0–46.0)
Hemoglobin: 13.7 g/dL (ref 12.0–15.0)
Immature Granulocytes: 1 %
Lymphocytes Relative: 14 %
Lymphs Abs: 0.8 10*3/uL (ref 0.7–4.0)
MCH: 30 pg (ref 26.0–34.0)
MCHC: 33 g/dL (ref 30.0–36.0)
MCV: 91 fL (ref 80.0–100.0)
Monocytes Absolute: 0.6 10*3/uL (ref 0.1–1.0)
Monocytes Relative: 9 %
Neutro Abs: 4.4 10*3/uL (ref 1.7–7.7)
Neutrophils Relative %: 76 %
Platelets: 367 10*3/uL (ref 150–400)
RBC: 4.56 MIL/uL (ref 3.87–5.11)
RDW: 11.3 % — ABNORMAL LOW (ref 11.5–15.5)
WBC: 5.9 10*3/uL (ref 4.0–10.5)
nRBC: 0 % (ref 0.0–0.2)

## 2019-07-31 LAB — D-DIMER, QUANTITATIVE: D-Dimer, Quant: 0.5 ug/mL-FEU (ref 0.00–0.50)

## 2019-07-31 LAB — COMPREHENSIVE METABOLIC PANEL
ALT: 130 U/L — ABNORMAL HIGH (ref 0–44)
AST: 61 U/L — ABNORMAL HIGH (ref 15–41)
Albumin: 3.2 g/dL — ABNORMAL LOW (ref 3.5–5.0)
Alkaline Phosphatase: 39 U/L (ref 38–126)
Anion gap: 8 (ref 5–15)
BUN: 20 mg/dL (ref 6–20)
CO2: 26 mmol/L (ref 22–32)
Calcium: 8.8 mg/dL — ABNORMAL LOW (ref 8.9–10.3)
Chloride: 103 mmol/L (ref 98–111)
Creatinine, Ser: 0.88 mg/dL (ref 0.44–1.00)
GFR calc Af Amer: >60 mL/min (ref >60)
GFR calc non Af Amer: >60 mL/min (ref >60)
Glucose, Bld: 132 mg/dL — ABNORMAL HIGH (ref 70–99)
Potassium: 4.7 mmol/L (ref 3.5–5.1)
Sodium: 137 mmol/L (ref 135–145)
Total Bilirubin: 0.8 mg/dL (ref 0.3–1.2)
Total Protein: 7.5 g/dL (ref 6.5–8.1)

## 2019-07-31 LAB — FERRITIN: Ferritin: 538 ng/mL — ABNORMAL HIGH (ref 11–307)

## 2019-07-31 LAB — MAGNESIUM: Magnesium: 2.5 mg/dL — ABNORMAL HIGH (ref 1.7–2.4)

## 2019-07-31 LAB — C-REACTIVE PROTEIN: CRP: 0.8 mg/dL (ref <1.0)

## 2019-07-31 MED ORDER — FAMOTIDINE 20 MG PO TABS
20.0000 mg | ORAL_TABLET | Freq: Every day | ORAL | Status: DC
  Administered 2019-07-31 – 2019-08-02 (×3): 20 mg via ORAL
  Filled 2019-07-31 (×3): qty 1

## 2019-07-31 NOTE — Progress Notes (Signed)
PROGRESS NOTE  Brenda Chambers WUJ:[id number] DOB: 1971/01/12 DOA: 07/27/2019  PCP: Patient, No Pcp Per  Brief History/Interval Summary: 48 year old female with history of hyperlipidemia, endometriosis presented with worsening COVID-19 infection.  Patient tested positive for COVID-19 on 07/22/2019.  She developed symptoms on  11/16 with fever, abdominal flank and back pain, myalgias and dry cough, after her friend had visited her home.    She came in due to worsening symptoms.  Noted to be hypoxic.  Was hospitalized for further management.    Reason for Visit: Pneumonia due to COVID-19.  Acute respiratory failure with hypoxia  Consultants: None  Procedures: None  Antibiotics: Anti-infectives (From admission, onward)   Start     Dose/Rate Route Frequency Ordered Stop   07/28/19 1603  remdesivir 100 mg in sodium chloride 0.9 % 250 mL IVPB     100 mg 500 mL/hr over 30 Minutes Intravenous Every 24 hours 07/27/19 1603 07/31/19 1047   07/27/19 1615  remdesivir 200 mg in sodium chloride 0.9 % 250 mL IVPB     200 mg 500 mL/hr over 30 Minutes Intravenous Once 07/27/19 1603 07/27/19 1832      Subjective/Interval History: Patient states that she slept reasonably well last night.  Shortness of breath is about the same.  Denies any chest pain.  Occasional cough.  No nausea vomiting.    Assessment/Plan:  Acute Hypoxic Resp. Failure/Pneumonia due to COVID-19  COVID-19 Labs  Recent Labs    07/29/19 0447 07/30/19 0803 07/31/19 0310  DDIMER 0.48 0.45 0.50  FERRITIN 506* 726* 538*  CRP 3.1* 1.1* 0.8    On care everywhere there is positive Covid antigen test result from 07/22/2019 if you [facility].  Objective findings: Fever: She is afebrile Oxygen requirements: High flow nasal cannula.  8 L/min.  Saturating in the early 90s.  COVID 19 Therapeutics: Antibacterials: None Remdesivir: Day 5 today Steroids: Dexamethasone 6 mg twice daily  Diuretics: Was given Lasix yesterday, currently on hold. Actemra: Not given Convalescent Plasma: Ordered yesterday.  To be transfused this morning. Vitamin C and Zinc: Continue PUD Prophylaxis: Initiate Pepcid DVT Prophylaxis:  Lovenox 40 mg once a day  Patient remains tenuous from a respiratory standpoint.  She is requiring 8 L of oxygen by high flow nasal cannula.  Hopefully we can wean it down.  She feels well.  She will complete course of remdesivir today.  CRP is now normal.  Not a candidate for Actemra.  She is being transfused convalescent plasma this morning.  She consented for it yesterday.  Continue steroids.  Continue with incentive spirometry, mobilization.  Out of bed to chair.  D-dimer 0.5.  Ferritin 538.  Transaminitis Most likely due to COVID-19.  Monitor periodically.  History of endometriosis Continue home medications.  Noted to be on transdermal estradiol.  Obesity Estimated body mass index is 30.81 kg/m as calculated from the following:   Height as of this encounter: 5\' 6"  (1.676 m).   Weight as of this encounter: 86.6 kg.   DVT Prophylaxis: Lovenox Code Status: Full code Family Communication: Discussed with patient.  On her request we will call her wife as well. Disposition Plan: Mobilize.  Wean down oxygen.  Hopefully will be able to return home when improved.   Medications:  Scheduled: . sodium chloride   Intravenous Once  . AeroChamber Plus Flo-Vu Medium  1 each Other Once  . albuterol  2 puff Inhalation Q4H  . dexamethasone (DECADRON) injection  6 mg Intravenous Q12H  . docusate sodium  100 mg Oral BID  . enoxaparin (LOVENOX) injection  40 mg Subcutaneous Q24H  . estradiol  0.1 mg Transdermal Weekly  . influenza vac split quadrivalent PF  0.5 mL Intramuscular Tomorrow-1000  . sodium chloride flush  3 mL Intravenous Q12H  . sodium chloride flush  3 mL Intravenous Q12H  . vitamin C  500 mg Oral Daily  . zinc sulfate  220 mg Oral Daily   Continuous:  . sodium chloride Stopped (07/30/19 1753)   ZDG:UYQIHK chloride, acetaminophen, bisacodyl, HYDROcodone-homatropine, ondansetron **OR** ondansetron (ZOFRAN) IV, oxyCODONE, polyethylene glycol, sodium chloride flush   Objective:  Vital Signs  Vitals:   07/31/19 0738 07/31/19 0822 07/31/19 0842 07/31/19 1010  BP: 99/76 96/72 106/75 100/74  Pulse: 63 60 71   Resp: 18 18 (!) 22 18  Temp: 98.4 F (36.9 C) 98.3 F (36.8 C) 98.1 F (36.7 C) 98 F (36.7 C)  TempSrc: Oral Oral Oral Oral  SpO2: 95% 97% 93% 94%  Weight:      Height:        Intake/Output Summary (Last 24 hours) at 07/31/2019 1102 Last data filed at 07/31/2019 0945 Gross per 24 hour  Intake 1357.96 ml  Output 700 ml  Net 657.96 ml   Filed Weights   07/27/19 1343 07/27/19 2203  Weight: 86.2 kg 86.6 kg    General appearance: Awake alert.  In no distress Resp: Clear to auscultation bilaterally.  Normal effort Cardio: S1-S2 is normal regular.  No S3-S4.  No rubs murmurs or bruit GI: Abdomen is soft.  Nontender nondistended.  Bowel sounds are present normal.  No masses organomegaly Extremities: No edema.  Full range of motion of lower extremities. Neurologic: Alert and oriented x3.  No focal neurological deficits.    Lab Results:  Data Reviewed: I have personally reviewed following labs and imaging studies  CBC: Recent Labs  Lab 07/27/19 1408 07/28/19 0524 07/29/19 0447 07/30/19 0803 07/31/19 0310  WBC 5.9 8.4 6.1 7.2 5.9  NEUTROABS 4.5 6.8 4.4 5.1 4.4  HGB 13.8 13.7 13.9 14.3 13.7  HCT 42.8 41.7 42.3 43.4 41.5  MCV 93.2 91.2 91.2 90.4 91.0  PLT 217 242 251 343 367    Basic Metabolic Panel: Recent Labs  Lab 07/27/19 1408 07/28/19 0524 07/29/19 0447 07/30/19 0803 07/31/19 0310  NA 135 137 138 139 137  K 4.4 5.0 5.0 4.0 4.7  CL 99 98 101 103 103  CO2 24 26 25 24 26   GLUCOSE 89 135* 129* 111* 132*  BUN 12 16 17  22* 20  CREATININE 1.09* 0.94 0.89 0.89 0.88  CALCIUM 8.7* 9.0 8.8* 9.0 8.8*  MG   --  2.3 2.3 2.2 2.5*  PHOS  --  3.9 4.2 4.0  --     GFR: Estimated Creatinine Clearance: 87.6 mL/min (by C-G formula based on SCr of 0.88 mg/dL).  Liver Function Tests: Recent Labs  Lab 07/27/19 1408 07/28/19 0524 07/29/19 0447 07/30/19 0803 07/31/19 0310  AST 31 31 32 74* 61*  ALT 27 32 31 110* 130*  ALKPHOS 37* 41 35* 36* 39  BILITOT 0.6 0.6 0.8 0.8 0.8  PROT 7.5 7.7 7.4 7.4 7.5  ALBUMIN 3.2* 3.2* 2.9* 2.9* 3.2*    CBG: Recent Labs  Lab 07/27/19 1749  GLUCAP 116*    Anemia Panel: Recent Labs    07/30/19 0803 07/31/19 0310  FERRITIN 726* 538*    Recent Results (from the past 240 hour(s))  Blood Culture (routine x 2)     Status: None (Preliminary result)   Collection Time: 07/27/19  2:03 PM   Specimen: BLOOD  Result Value Ref Range Status   Specimen Description BLOOD LEFT ANTECUBITAL  Final   Special Requests   Final    BOTTLES DRAWN AEROBIC AND ANAEROBIC Blood Culture adequate volume   Culture   Final    NO GROWTH 4 DAYS Performed at [facility], [address] [place], [place] [address]    Report Status PENDING  Incomplete  Blood Culture (routine x 2)     Status: None (Preliminary result)   Collection Time: 07/27/19  2:17 PM   Specimen: BLOOD  Result Value Ref Range Status   Specimen Description BLOOD RIGHT ANTECUBITAL  Final   Special Requests   Final    BOTTLES DRAWN AEROBIC AND ANAEROBIC Blood Culture results may not be optimal due to an inadequate volume of blood received in culture bottles   Culture   Final    NO GROWTH 4 DAYS Performed at [facility], [place] [address] [place], [place] [address]    Report Status PENDING  Incomplete      Radiology Studies: Cxr Portable Same Day  Result Date: 07/30/2019 CLINICAL DATA:  Dyspnea EXAM: PORTABLE CHEST 1 VIEW COMPARISON:  Chest x-ray dated 07/27/2019. FINDINGS: Patchy bibasilar airspace opacities, stable. Upper lungs remain clear. Heart size and mediastinal contours are stable.  Osseous structures about the chest are unremarkable. IMPRESSION: Stable patchy bibasilar airspace opacities, compatible with multifocal pneumonia given the recent COVID-19 diagnosis. Electronically Signed   By: Franki Cabot M.D.   On: 07/30/2019 09:32       LOS: 4 days   [facility] Pager on [email or web address]  07/31/2019, 11:02 AM

## 2019-07-31 NOTE — Progress Notes (Signed)
Just received call from lab, plasma has just arrived to [facility] for patient, will defer to dayshift due to upcoming shift change, lab said for oncoming nurse to call when ready, plasma able to be held in lab until 1230. Will let patient know that she will be receiving her plasma shortly.

## 2019-07-31 NOTE — Plan of Care (Signed)
Updated pt on POC, pt verbalized understanding 

## 2019-07-31 NOTE — Progress Notes (Addendum)
Pt seemed to have slept well overnight with no respiratory complaints, already using incentive spirometer this morning, Will continue to monitor throughout shift. Sats maintained at 93-96% on HFNC at 8 liters.

## 2019-08-01 LAB — COMPREHENSIVE METABOLIC PANEL
ALT: 89 U/L — ABNORMAL HIGH (ref 0–44)
AST: 29 U/L (ref 15–41)
Albumin: 3.1 g/dL — ABNORMAL LOW (ref 3.5–5.0)
Alkaline Phosphatase: 39 U/L (ref 38–126)
Anion gap: 11 (ref 5–15)
BUN: 18 mg/dL (ref 6–20)
CO2: 23 mmol/L (ref 22–32)
Calcium: 9 mg/dL (ref 8.9–10.3)
Chloride: 103 mmol/L (ref 98–111)
Creatinine, Ser: 0.78 mg/dL (ref 0.44–1.00)
GFR calc Af Amer: >60 mL/min (ref >60)
GFR calc non Af Amer: >60 mL/min (ref >60)
Glucose, Bld: 128 mg/dL — ABNORMAL HIGH (ref 70–99)
Potassium: 4.4 mmol/L (ref 3.5–5.1)
Sodium: 137 mmol/L (ref 135–145)
Total Bilirubin: 0.5 mg/dL (ref 0.3–1.2)
Total Protein: 7.3 g/dL (ref 6.5–8.1)

## 2019-08-01 LAB — CBC WITH DIFFERENTIAL/PLATELET
Abs Immature Granulocytes: 0.05 10*3/uL (ref 0.00–0.07)
Basophils Absolute: 0 10*3/uL (ref 0.0–0.1)
Basophils Relative: 0 %
Eosinophils Absolute: 0 10*3/uL (ref 0.0–0.5)
Eosinophils Relative: 0 %
HCT: 40.7 % (ref 36.0–46.0)
Hemoglobin: 13.3 g/dL (ref 12.0–15.0)
Immature Granulocytes: 1 %
Lymphocytes Relative: 14 %
Lymphs Abs: 0.8 10*3/uL (ref 0.7–4.0)
MCH: 29.6 pg (ref 26.0–34.0)
MCHC: 32.7 g/dL (ref 30.0–36.0)
MCV: 90.4 fL (ref 80.0–100.0)
Monocytes Absolute: 0.5 10*3/uL (ref 0.1–1.0)
Monocytes Relative: 8 %
Neutro Abs: 4.3 10*3/uL (ref 1.7–7.7)
Neutrophils Relative %: 77 %
Platelets: 394 10*3/uL (ref 150–400)
RBC: 4.5 MIL/uL (ref 3.87–5.11)
RDW: 11.3 % — ABNORMAL LOW (ref 11.5–15.5)
WBC: 5.6 10*3/uL (ref 4.0–10.5)
nRBC: 0 % (ref 0.0–0.2)

## 2019-08-01 LAB — CULTURE, BLOOD (ROUTINE X 2)
Culture: NO GROWTH
Culture: NO GROWTH
Special Requests: ADEQUATE

## 2019-08-01 LAB — BPAM FFP
Blood Product Expiration Date: 202012010557
ISSUE DATE / TIME: 202011300603
Unit Type and Rh: 1700

## 2019-08-01 LAB — MAGNESIUM: Magnesium: 2.2 mg/dL (ref 1.7–2.4)

## 2019-08-01 LAB — FERRITIN: Ferritin: 520 ng/mL — ABNORMAL HIGH (ref 11–307)

## 2019-08-01 LAB — PREPARE FRESH FROZEN PLASMA

## 2019-08-01 MED ORDER — HALOPERIDOL LACTATE 5 MG/ML IJ SOLN
2.0000 mg | Freq: Once | INTRAMUSCULAR | Status: AC
  Administered 2019-08-01: 2 mg via INTRAVENOUS
  Filled 2019-08-01: qty 1

## 2019-08-01 MED ORDER — DEXAMETHASONE 6 MG PO TABS
6.0000 mg | ORAL_TABLET | Freq: Every day | ORAL | Status: DC
  Administered 2019-08-02: 6 mg via ORAL
  Filled 2019-08-01: qty 1

## 2019-08-01 MED ORDER — HALOPERIDOL LACTATE 5 MG/ML IJ SOLN: 2.0000 mg | Freq: Four times a day (QID) | INTRAMUSCULAR | Status: DC | PRN

## 2019-08-01 NOTE — Progress Notes (Signed)
PROGRESS NOTE  Brenda DuelKristal L Chambers AOZ:[id number] DOB: 11-03-1970 DOA: 07/27/2019  PCP: Patient, No Pcp Per  Brief History/Interval Summary: 48 year old female with history of hyperlipidemia, endometriosis presented with worsening COVID-19 infection.  Patient tested positive for COVID-19 on 07/22/2019.  She developed symptoms on  11/16 with fever, abdominal flank and back pain, myalgias and dry cough, after her friend had visited her home.    She came in due to worsening symptoms.  Noted to be hypoxic.  Was hospitalized for further management.    Reason for Visit: Pneumonia due to COVID-19.  Acute respiratory failure with hypoxia  Consultants: None  Procedures: None  Antibiotics: Anti-infectives (From admission, onward)   Start     Dose/Rate Route Frequency Ordered Stop   07/28/19 1603  remdesivir 100 mg in sodium chloride 0.9 % 250 mL IVPB     100 mg 500 mL/hr over 30 Minutes Intravenous Every 24 hours 07/27/19 1603 07/31/19 1116   07/27/19 1615  remdesivir 200 mg in sodium chloride 0.9 % 250 mL IVPB     200 mg 500 mL/hr over 30 Minutes Intravenous Once 07/27/19 1603 07/27/19 1832      Subjective/Interval History: Patient noted to be very belligerent this morning.  She is very angry.  She wants to get out of the hospital.  She is acting quite inappropriately.  Did not answer any of my questions regarding her medical condition.      Assessment/Plan:  Acute Hypoxic Resp. Failure/Pneumonia due to COVID-19  COVID-19 Labs  Recent Labs    07/30/19 0803 07/31/19 0310 08/01/19 0345  DDIMER 0.45 0.50  --   FERRITIN 726* 538* 520*  CRP 1.1* 0.8  --     On care everywhere there is positive Covid antigen test result from 07/22/2019 if you check under [facility].  Objective findings: Fever: Afebrile Oxygen requirements: High flow nasal cannula.  2 L/min.  Saturating in the early 90s.  COVID 19 Therapeutics: Antibacterials: None Remdesivir:  Completed course yesterday Steroids: Dexamethasone 6 mg twice daily.  Will decrease the dose of steroids due to possible psychosis. Diuretics: She was given 1 dose on the day of admission.  None since then. Actemra: Not given Convalescent Plasma: Transfused on 11/30 Vitamin C and Zinc: Continue PUD Prophylaxis: Pepcid DVT Prophylaxis:  Lovenox 40 mg once a day  From a respiratory standpoint patient seems to be improving.  She was requiring 8 L of oxygen but she has been weaned down to 2 L.  She is saturating in the early 90s.  She has completed course of remdesivir.  CRP is now normal.  She was given convalescent plasma.  Due to concern for psychosis which could be from steroids we will start decreasing the dose of dexamethasone.  D-dimer was normal.  Ferritin improving.  Continue to mobilize.  Incentive spirometry.  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  She does not understand the severity of her illness.  She states that she wants to go home.  I tried to explain to her that she still is requiring oxygen and is on the path to recovery but not ready for discharge.  She is unable to express a reason for why she wants to go home and not take further medical treatment.  She lacks an understanding of the nature of the disease and the risks of decompensation if she goes home too early.  She is unable to rationally manipulate information to make an informed decision.  Due to all of the above reasons I do not think patient has capacity to make informed decision today. This could all be due to steroids.  We will decrease the dose.  Haldol x1.   Transaminitis Most likely due to COVID-19.  Monitor periodically.  History of endometriosis Continue home medications.  Noted to be on transdermal estradiol.  Obesity Estimated body mass index is 30.81 kg/m as calculated from the following:   Height as of this encounter: 5\' 6"  (1.676 m).   Weight as of this encounter: 86.6  kg.   DVT Prophylaxis: Lovenox Code Status: Full code Family Communication: Her wife being updated on daily basis Disposition Plan: Hopefully home when improved.  Not ready for discharge today.   Medications:  Scheduled: . sodium chloride   Intravenous Once  . AeroChamber Plus Flo-Vu Medium  1 each Other Once  . albuterol  2 puff Inhalation Q4H  . [START ON 08/02/2019] dexamethasone  6 mg Oral Daily  . docusate sodium  100 mg Oral BID  . enoxaparin (LOVENOX) injection  40 mg Subcutaneous Q24H  . estradiol  0.1 mg Transdermal Weekly  . famotidine  20 mg Oral Daily  . influenza vac split quadrivalent PF  0.5 mL Intramuscular Tomorrow-1000  . sodium chloride flush  3 mL Intravenous Q12H  . vitamin C  500 mg Oral Daily  . zinc sulfate  220 mg Oral Daily   Continuous: . sodium chloride Stopped (07/30/19 1749)   ZOX:WRUEAV chloride, acetaminophen, bisacodyl, HYDROcodone-homatropine, ondansetron **OR** ondansetron (ZOFRAN) IV, oxyCODONE, polyethylene glycol, sodium chloride flush   Objective:  Vital Signs  Vitals:   08/01/19 0008 08/01/19 0304 08/01/19 0820 08/01/19 0950  BP: 128/88 117/87 113/87 108/77  Pulse: (!) 58 61  76  Resp: 19 18 20 16   Temp: 97.9 F (36.6 C) 98.2 F (36.8 C) 98.2 F (36.8 C)   TempSrc: Oral Oral Oral   SpO2: 93% 92%  92%  Weight:      Height:        Intake/Output Summary (Last 24 hours) at 08/01/2019 1106 Last data filed at 08/01/2019 0322 Gross per 24 hour  Intake 379.64 ml  Output 1100 ml  Net -720.36 ml   Filed Weights   07/27/19 1343 07/27/19 2203  Weight: 86.2 kg 86.6 kg    General appearance: Angry and belligerent this morning. Resp: Improved air entry bilaterally.  Crackles at the bases.  No wheezing or rhonchi.  Mildly tachypneic at rest. Cardio: S1-S2 is normal regular.  No S3-S4.  No rubs murmurs or bruit GI: Abdomen is soft.  Nontender nondistended.  Bowel sounds are present normal.  No masses organomegaly Extremities: No  edema.  Full range of motion of lower extremities. Neurologic: Belligerent.  Does not answer questions appropriately.  No obvious focal neurological deficits.    Lab Results:  Data Reviewed: I have personally reviewed following labs and imaging studies  CBC: Recent Labs  Lab 07/28/19 0524 07/29/19 0447 07/30/19 0803 07/31/19 0310 08/01/19 0345  WBC 8.4 6.1 7.2 5.9 5.6  NEUTROABS 6.8 4.4 5.1 4.4 4.3  HGB 13.7 13.9 14.3 13.7 13.3  HCT 41.7 42.3 43.4 41.5 40.7  MCV 91.2 91.2 90.4 91.0 90.4  PLT 242 251 343 367 409    Basic Metabolic Panel: Recent Labs  Lab 07/28/19 0524 07/29/19 0447 07/30/19 0803 07/31/19 0310 08/01/19 0345  NA 137 138 139 137 137  K 5.0 5.0 4.0 4.7 4.4  CL 98 101 103 103 103  CO2 26  25 24 26 23   GLUCOSE 135* 129* 111* 132* 128*  BUN 16 17 22* 20 18  CREATININE 0.94 0.89 0.89 0.88 0.78  CALCIUM 9.0 8.8* 9.0 8.8* 9.0  MG 2.3 2.3 2.2 2.5* 2.2  PHOS 3.9 4.2 4.0  --   --     GFR: Estimated Creatinine Clearance: 96.3 mL/min (by C-G formula based on SCr of 0.78 mg/dL).  Liver Function Tests: Recent Labs  Lab 07/28/19 0524 07/29/19 0447 07/30/19 0803 07/31/19 0310 08/01/19 0345  AST 31 32 74* 61* 29  ALT 32 31 110* 130* 89*  ALKPHOS 41 35* 36* 39 39  BILITOT 0.6 0.8 0.8 0.8 0.5  PROT 7.7 7.4 7.4 7.5 7.3  ALBUMIN 3.2* 2.9* 2.9* 3.2* 3.1*    CBG: Recent Labs  Lab 07/27/19 1749  GLUCAP 116*    Anemia Panel: Recent Labs    07/31/19 0310 08/01/19 0345  FERRITIN 538* 520*    Recent Results (from the past 240 hour(s))  Blood Culture (routine x 2)     Status: None   Collection Time: 07/27/19  2:03 PM   Specimen: BLOOD  Result Value Ref Range Status   Specimen Description BLOOD LEFT ANTECUBITAL  Final   Special Requests   Final    BOTTLES DRAWN AEROBIC AND ANAEROBIC Blood Culture adequate volume   Culture   Final    NO GROWTH 5 DAYS Performed at [facility], [address], [place], [place]    Report Status  08/01/2019 FINAL  Final  Blood Culture (routine x 2)     Status: None   Collection Time: 07/27/19  2:17 PM   Specimen: BLOOD  Result Value Ref Range Status   Specimen Description BLOOD RIGHT ANTECUBITAL  Final   Special Requests   Final    BOTTLES DRAWN AEROBIC AND ANAEROBIC Blood Culture results may not be optimal due to an inadequate volume of blood received in culture bottles   Culture   Final    NO GROWTH 5 DAYS Performed at [facility], [address] [place], [place]    Report Status 08/01/2019 FINAL  Final      Radiology Studies: No results found.     LOS: 5 days   Yailen Zemaitis 14/08/2018 on [email or web address]  08/01/2019, 11:06 AM

## 2019-08-01 NOTE — Plan of Care (Signed)

## 2019-08-02 DIAGNOSIS — F19959 Other psychoactive substance use, unspecified with psychoactive substance-induced psychotic disorder, unspecified: Secondary | ICD-10-CM

## 2019-08-02 LAB — CBC WITH DIFFERENTIAL/PLATELET
Abs Immature Granulocytes: 0.14 10*3/uL — ABNORMAL HIGH (ref 0.00–0.07)
Basophils Absolute: 0 10*3/uL (ref 0.0–0.1)
Basophils Relative: 0 %
Eosinophils Absolute: 0.1 10*3/uL (ref 0.0–0.5)
Eosinophils Relative: 1 %
HCT: 40.8 % (ref 36.0–46.0)
Hemoglobin: 13.6 g/dL (ref 12.0–15.0)
Immature Granulocytes: 2 %
Lymphocytes Relative: 28 %
Lymphs Abs: 1.7 10*3/uL (ref 0.7–4.0)
MCH: 29.8 pg (ref 26.0–34.0)
MCHC: 33.3 g/dL (ref 30.0–36.0)
MCV: 89.5 fL (ref 80.0–100.0)
Monocytes Absolute: 0.5 10*3/uL (ref 0.1–1.0)
Monocytes Relative: 8 %
Neutro Abs: 3.7 10*3/uL (ref 1.7–7.7)
Neutrophils Relative %: 61 %
Platelets: 427 10*3/uL — ABNORMAL HIGH (ref 150–400)
RBC: 4.56 MIL/uL (ref 3.87–5.11)
RDW: 11.3 % — ABNORMAL LOW (ref 11.5–15.5)
WBC: 6.1 10*3/uL (ref 4.0–10.5)
nRBC: 0 % (ref 0.0–0.2)

## 2019-08-02 LAB — MAGNESIUM: Magnesium: 2.1 mg/dL (ref 1.7–2.4)

## 2019-08-02 LAB — COMPREHENSIVE METABOLIC PANEL
ALT: 67 U/L — ABNORMAL HIGH (ref 0–44)
AST: 24 U/L (ref 15–41)
Albumin: 3.1 g/dL — ABNORMAL LOW (ref 3.5–5.0)
Alkaline Phosphatase: 38 U/L (ref 38–126)
Anion gap: 12 (ref 5–15)
BUN: 18 mg/dL (ref 6–20)
CO2: 21 mmol/L — ABNORMAL LOW (ref 22–32)
Calcium: 8.8 mg/dL — ABNORMAL LOW (ref 8.9–10.3)
Chloride: 106 mmol/L (ref 98–111)
Creatinine, Ser: 0.87 mg/dL (ref 0.44–1.00)
GFR calc Af Amer: >60 mL/min (ref >60)
GFR calc non Af Amer: >60 mL/min (ref >60)
Glucose, Bld: 83 mg/dL (ref 70–99)
Potassium: 4 mmol/L (ref 3.5–5.1)
Sodium: 139 mmol/L (ref 135–145)
Total Bilirubin: 0.7 mg/dL (ref 0.3–1.2)
Total Protein: 7.1 g/dL (ref 6.5–8.1)

## 2019-08-02 LAB — FERRITIN: Ferritin: 489 ng/mL — ABNORMAL HIGH (ref 11–307)

## 2019-08-02 NOTE — Discharge Summary (Addendum)
Physician Discharge Summary  Brenda Chambers OIB:[id number] DOB: 07-29-1971 DOA: 07/27/2019  PCP: Patient, No Pcp Per  Admit date: 07/27/2019 Discharge date: 08/02/2019  Admitted From: Home Disposition:  Home  Recommendations for Outpatient Follow-up:  1. Follow up with PCP in 1-2 weeks 2. Please obtain BMP/CBC in one week   Home Health:No Equipment/Devices:None  Discharge Condition:Stable CODE STATUS:Full Diet recommendation: Heart Healthy  Brief/Interim Summary: 48 y.o. female past medical history of hyperlipidemia, endometriosis presents the ED with fever abdominal flank pain, back pain myalgias dry cough since 07/17/2019.  She relates her symptoms have been worsening.  Discharge Diagnoses:  Active Problems:   Dyslipidemia   Endometriosis   Pneumonia due to COVID-19 virus   Acute hypoxemic respiratory failure (HCC)   Steroid-induced psychosis with complication (HCC) Acute respiratory failure with hypoxia due to COVID-19 viral pneumonia: On admission she was placed on supplemental oxygen try to keep saturations greater than 94%. She was started on IV remdesivir and steroids for which she completed a 5-day course.  Also on admission she was given convalescent plasma 07/31/2019. She was continue vitamin C and zinc she was weaned off oxygen to room air and she was discharged in stable condition.  Steroid-induced psychosis: She had a bizarre episode on 08/01/2019 that she wanted to go home but did not understand the severity of illness her steroids were decreased.  On the day of evaluation on 08/02/2019 she related she understood the risk and benefits of going home yesterday and she accepted them she was just very anxious and wanted to go home she relates she this is never happened in the past. She was willing to stay voluntarily when I spoke to her on the day of discharge as her saturations were significantly improve and it seems that her baseline mentation was stable and she was  rational about her thinking she was discharged in stable condition.  Transaminitis likely due to COVID-19.  History of  endometriosis: Continue current medication.  Obesity: Noted.   Discharge Instructions  Discharge Instructions    Diet - low sodium heart healthy   Complete by: As directed    Increase activity slowly   Complete by: As directed    MyChart COVID-19 home monitoring program   Complete by: Aug 02, 2019    Is the patient willing to use the [place] for home monitoring?: Yes   Temperature monitoring   Complete by: Aug 02, 2019    After how many days would you like to receive a notification of this patient's flowsheet entries?: 1     Allergies as of 08/02/2019      Reactions   Cephalosporins Itching      Medication List    STOP taking these medications   azithromycin 250 MG tablet Commonly known as: ZITHROMAX   dexamethasone 6 MG tablet Commonly known as: DECADRON   methylPREDNISolone 4 MG Tbpk tablet Commonly known as: MEDROL DOSEPAK     TAKE these medications   albuterol 108 (90 Base) MCG/ACT inhaler Commonly known as: VENTOLIN HFA Inhale 1-2 puffs into the lungs every 4 (four) hours as needed for shortness of breath or wheezing. May take every 3 hours.   albuterol (2.5 MG/3ML) 0.083% nebulizer solution Commonly known as: PROVENTIL Take 2.5 mg by nebulization 3 (three) times daily.   estradiol 0.075 MG/24HR Commonly known as: VIVELLE-DOT Place 1 patch onto the skin 2 (two) times a week.   Hydromet 5-1.5 MG/5ML syrup Generic drug: HYDROcodone-homatropine Take 5 mLs by mouth  every 4 (four) hours as needed.       Allergies  Allergen Reactions  . Cephalosporins Itching    Consultations:  None   Procedures/Studies: Dg Chest Portable 1 View  Result Date: 07/27/2019 CLINICAL DATA:  Shortness of breath, COVID positive. EXAM: PORTABLE CHEST 1 VIEW COMPARISON:  06/20/2011 FINDINGS: Lung volumes are low. Cardiomediastinal  contours are normal accounting for low lung volumes. Patchy opacities are seen bilaterally, worse at the lung bases with some peripheral predominance. Low lung volumes limit assessment. No signs of acute bone process. IMPRESSION: Low volume chest with findings that could be seen in the setting of atypical or viral pneumonia. Electronically Signed   By: Donzetta KohutGeoffrey  Wile M.D.   On: 07/27/2019 14:35   Cxr Portable Same Day  Result Date: 07/30/2019 CLINICAL DATA:  Dyspnea EXAM: PORTABLE CHEST 1 VIEW COMPARISON:  Chest x-ray dated 07/27/2019. FINDINGS: Patchy bibasilar airspace opacities, stable. Upper lungs remain clear. Heart size and mediastinal contours are stable. Osseous structures about the chest are unremarkable. IMPRESSION: Stable patchy bibasilar airspace opacities, compatible with multifocal pneumonia given the recent COVID-19 diagnosis. Electronically Signed   By: Bary RichardStan  Maynard M.D.   On: 07/30/2019 09:32   (Echo, Carotid, EGD, Colonoscopy, ERCP)    Subjective: No complaints feels great, she is very appreciative of the care [facility]  Discharge Exam: Vitals:   08/02/19 0317 08/02/19 0825  BP: 117/87 107/81  Pulse: 66 69  Resp: 18 16  Temp: 98.6 F (37 C) 97.8 F (36.6 C)  SpO2: 99% 97%   Vitals:   08/01/19 2046 08/01/19 2051 08/02/19 0317 08/02/19 0825  BP: 111/90 111/83 117/87 107/81  Pulse: 67 65 66 69  Resp: 20 18 18 16   Temp: 99 F (37.2 C) 98.4 F (36.9 C) 98.6 F (37 C) 97.8 F (36.6 C)  TempSrc: Oral Oral Oral Oral  SpO2: 94% 95% 99% 97%  Weight:      Height:        General: Pt is alert, awake, not in acute distress Cardiovascular: RRR, S1/S2 +, no rubs, no gallops Respiratory: CTA bilaterally, no wheezing, no rhonchi Abdominal: Soft, NT, ND, bowel sounds + Extremities: no edema, no cyanosis    The results of significant diagnostics from this hospitalization (including imaging, microbiology, ancillary and laboratory) are listed  below for reference.     Microbiology: Recent Results (from the past 240 hour(s))  Blood Culture (routine x 2)     Status: None   Collection Time: 07/27/19  2:03 PM   Specimen: BLOOD  Result Value Ref Range Status   Specimen Description BLOOD LEFT ANTECUBITAL  Final   Special Requests   Final    BOTTLES DRAWN AEROBIC AND ANAEROBIC Blood Culture adequate volume   Culture   Final    NO GROWTH 5 DAYS Performed at [facility], [address] [place], [place] [id number]    Report Status 08/01/2019 FINAL  Final  Blood Culture (routine x 2)     Status: None   Collection Time: 07/27/19  2:17 PM   Specimen: BLOOD  Result Value Ref Range Status   Specimen Description BLOOD RIGHT ANTECUBITAL  Final   Special Requests   Final    BOTTLES DRAWN AEROBIC AND ANAEROBIC Blood Culture results may not be optimal due to an inadequate volume of blood received in culture bottles   Culture   Final    NO GROWTH 5 DAYS Performed at [facility], [address] [place], [place] [address]    Report Status 08/01/2019 FINAL  Final     Labs: BNP (last 3 results) Recent Labs    07/29/19 0447  BNP 17.1   Basic Metabolic Panel: Recent Labs  Lab 07/28/19 0524 07/29/19 0447 07/30/19 0803 07/31/19 0310 08/01/19 0345 08/02/19 0002  NA 137 138 139 137 137 139  K 5.0 5.0 4.0 4.7 4.4 4.0  CL 98 101 103 103 103 106  CO2 21*  GLUCOSE 135* 129* 111* 132* 128* 83  BUN 16 17 22* CREATININE 0.94 0.89 0.89 0.88 0.78 0.87  CALCIUM 9.0 8.8* 9.0 8.8* 9.0 8.8*  MG 2.3 2.3 2.2 2.5* 2.2 2.1  PHOS 3.9 4.2 4.0  --   --   --    Liver Function Tests: Recent Labs  Lab 07/29/19 0447 07/30/19 0803 07/31/19 0310 08/01/19 0345 08/02/19 0002  AST 32 74* 61* 29 24  ALT 31 110* 130* 89* 67*  ALKPHOS 35* 36* 39 39 38  BILITOT 0.8 0.8 0.8 0.5 0.7  PROT 7.4 7.4 7.5 7.3 7.1  ALBUMIN 2.9* 2.9* 3.2* 3.1* 3.1*   No results for input(s): LIPASE, AMYLASE in the last 168  hours. No results for input(s): AMMONIA in the last 168 hours. CBC: Recent Labs  Lab 07/29/19 0447 07/30/19 0803 07/31/19 0310 08/01/19 0345 08/02/19 0002  WBC 6.1 7.2 5.9 5.6 6.1  NEUTROABS 4.4 5.1 4.4 4.3 3.7  HGB 13.9 14.3 13.7 13.3 13.6  HCT 42.3 43.4 41.5 40.7 40.8  MCV 91.2 90.4 91.0 90.4 89.5  PLT 251 343 367 394 427*   Cardiac Enzymes: No results for input(s): CKTOTAL, CKMB, CKMBINDEX, TROPONINI in the last 168 hours. BNP: Invalid input(s): POCBNP CBG: Recent Labs  Lab 07/27/19 1749  GLUCAP 116*   D-Dimer Recent Labs    07/31/19 0310  DDIMER 0.50   Hgb A1c No results for input(s): HGBA1C in the last 72 hours. Lipid Profile No results for input(s): CHOL, HDL, LDLCALC, TRIG, CHOLHDL, LDLDIRECT in the last 72 hours. Thyroid function studies No results for input(s): TSH, T4TOTAL, T3FREE, THYROIDAB in the last 72 hours.  Invalid input(s): FREET3 Anemia work up Recent Labs    08/01/19 0345 08/02/19 0002  FERRITIN 520* 489*   Urinalysis    Component Value Date/Time   COLORURINE YELLOW 09/26/2007 2052   APPEARANCEUR CLEAR 09/26/2007 2052   LABSPEC 1.013 09/26/2007 2052   PHURINE 5.5 09/26/2007 2052   GLUCOSEU NEGATIVE 09/26/2007 2052   HGBUR NEGATIVE 09/26/2007 2052   BILIRUBINUR NEGATIVE 09/26/2007 2052   KETONESUR NEGATIVE 09/26/2007 2052   PROTEINUR NEGATIVE 09/26/2007 2052   UROBILINOGEN 0.2 09/26/2007 2052   NITRITE NEGATIVE 09/26/2007 2052   LEUKOCYTESUR  09/26/2007 2052    NEGATIVE MICROSCOPIC NOT DONE ON URINES WITH NEGATIVE PROTEIN, BLOOD, LEUKOCYTES, NITRITE, OR GLUCOSE <1000 mg/dL.   Sepsis Labs Invalid input(s): PROCALCITONIN,  WBC,  LACTICIDVEN Microbiology Recent Results (from the past 240 hour(s))  Blood Culture (routine x 2)     Status: None   Collection Time: 07/27/19  2:03 PM   Specimen: BLOOD  Result Value Ref Range Status   Specimen Description BLOOD LEFT ANTECUBITAL  Final   Special Requests   Final    BOTTLES DRAWN  AEROBIC AND ANAEROBIC Blood Culture adequate volume   Culture   Final    NO GROWTH 5 DAYS Performed at [facility], [address] [place], [place] [address]  Report Status 08/01/2019 FINAL  Final  Blood Culture (routine x 2)     Status: None   Collection Time: 07/27/19  2:17 PM   Specimen: BLOOD  Result Value Ref Range Status   Specimen Description BLOOD RIGHT ANTECUBITAL  Final   Special Requests   Final    BOTTLES DRAWN AEROBIC AND ANAEROBIC Blood Culture results may not be optimal due to an inadequate volume of blood received in culture bottles   Culture   Final    NO GROWTH 5 DAYS Performed at [facility], [address], [place], [place] [address]    Report Status 08/01/2019 FINAL  Final     Time coordinating discharge: Over 35 minutes  SIGNED:   Marinda Elk, MD  [facility] 08/02/2019, 11:25 AM Pager   If 7PM-7AM, please contact night-coverage [email or web address] Password [email or web address]

## 2019-08-02 NOTE — Discharge Instructions (Signed)
After your discharge from the Hospital:  1. Call to make a Follow up Appointment with your Primary Care Doctor in 1-2 weeks 2. Please have your PCP obtain blood work --> BMP/CBC in one week      Person Under Monitoring Name: Brenda Chambers  Location: [address] [place] [address]   Infection Prevention Recommendations for Individuals Confirmed to have, or Being Evaluated for, 2019 Novel Coronavirus (COVID-19) Infection Who Receive Care at Home  Individuals who are confirmed to have, or are being evaluated for, COVID-19 should follow the prevention steps below until a healthcare provider or local or state health department says they can return to normal activities.  Stay home except to get medical care You should restrict activities outside your home, except for getting medical care. Do not go to work, school, or public areas, and do not use public transportation or taxis.  Call ahead before visiting your doctor Before your medical appointment, call the healthcare provider and tell them that you have, or are being evaluated for, COVID-19 infection. This will help the healthcare providers office take steps to keep other people from getting infected. Ask your healthcare provider to call the local or state health department.  Monitor your symptoms Seek prompt medical attention if your illness is worsening (e.g., difficulty breathing). Before going to your medical appointment, call the healthcare provider and tell them that you have, or are being evaluated for, COVID-19 infection. Ask your healthcare provider to call the local or state health department.  Wear a facemask You should wear a facemask that covers your nose and mouth when you are in the same room with other people and when you visit a healthcare provider. People who live with or visit you should also wear a facemask while they are in the same room with you.  Separate yourself from other people in  your home As much as possible, you should stay in a different room from other people in your home. Also, you should use a separate bathroom, if available.  Avoid sharing household items You should not share dishes, drinking glasses, cups, eating utensils, towels, bedding, or other items with other people in your home. After using these items, you should wash them thoroughly with soap and water.  Cover your coughs and sneezes Cover your mouth and nose with a tissue when you cough or sneeze, or you can cough or sneeze into your sleeve. Throw used tissues in a lined trash can, and immediately wash your hands with soap and water for at least 20 seconds or use an alcohol-based hand rub.  Wash your [employer] your hands often and thoroughly with soap and water for at least 20 seconds. You can use an alcohol-based hand sanitizer if soap and water are not available and if your hands are not visibly dirty. Avoid touching your eyes, nose, and mouth with unwashed hands.   Prevention Steps for Caregivers and Household Members of Individuals Confirmed to have, or Being Evaluated for, COVID-19 Infection Being Cared for in the Home  If you live with, or provide care at home for, a person confirmed to have, or being evaluated for, COVID-19 infection please follow these guidelines to prevent infection:  Follow healthcare providers instructions Make sure that you understand and can help the patient follow any healthcare provider instructions for all care.  Provide for the patients basic needs You should help the patient with basic needs in the home and provide support for getting groceries, prescriptions, and other  personal needs.  Monitor the patients symptoms If they are getting sicker, call his or her medical provider and tell them that the patient has, or is being evaluated for, COVID-19 infection. This will help the healthcare providers office take steps to keep other people from getting  infected. Ask the healthcare provider to call the local or state health department.  Limit the number of people who have contact with the patient  If possible, have only one caregiver for the patient.  Other household members should stay in another home or place of residence. If this is not possible, they should stay  in another room, or be separated from the patient as much as possible. Use a separate bathroom, if available.  Restrict visitors who do not have an essential need to be in the home.  Keep older adults, very young children, and other sick people away from the patient Keep older adults, very young children, and those who have compromised immune systems or chronic health conditions away from the patient. This includes people with chronic heart, lung, or kidney conditions, diabetes, and cancer.  Ensure good ventilation Make sure that shared spaces in the home have good air flow, such as from an air conditioner or an opened window, weather permitting.  Wash your hands often  Wash your hands often and thoroughly with soap and water for at least 20 seconds. You can use an alcohol based hand sanitizer if soap and water are not available and if your hands are not visibly dirty.  Avoid touching your eyes, nose, and mouth with unwashed hands.  Use disposable paper towels to dry your hands. If not available, use dedicated cloth towels and replace them when they become wet.  Wear a facemask and gloves  Wear a disposable facemask at all times in the room and gloves when you touch or have contact with the patients blood, body fluids, and/or secretions or excretions, such as sweat, saliva, sputum, nasal mucus, vomit, urine, or feces.  Ensure the mask fits over your nose and mouth tightly, and do not touch it during use.  Throw out disposable facemasks and gloves after using them. Do not reuse.  Wash your hands immediately after removing your facemask and gloves.  If your personal  clothing becomes contaminated, carefully remove clothing and launder. Wash your hands after handling contaminated clothing.  Place all used disposable facemasks, gloves, and other waste in a lined container before disposing them with other household waste.  Remove gloves and wash your hands immediately after handling these items.  Do not share dishes, glasses, or other household items with the patient  Avoid sharing household items. You should not share dishes, drinking glasses, cups, eating utensils, towels, bedding, or other items with a patient who is confirmed to have, or being evaluated for, COVID-19 infection.  After the person uses these items, you should wash them thoroughly with soap and water.  Wash laundry thoroughly  Immediately remove and wash clothes or bedding that have blood, body fluids, and/or secretions or excretions, such as sweat, saliva, sputum, nasal mucus, vomit, urine, or feces, on them.  Wear gloves when handling laundry from the patient.  Read and follow directions on labels of laundry or clothing items and detergent. In general, wash and dry with the warmest temperatures recommended on the label.  Clean all areas the individual has used often  Clean all touchable surfaces, such as counters, tabletops, doorknobs, bathroom fixtures, toilets, phones, keyboards, tablets, and bedside tables, every day. Also, clean  any surfaces that may have blood, body fluids, and/or secretions or excretions on them.  Wear gloves when cleaning surfaces the patient has come in contact with.  Use a diluted bleach solution (e.g., dilute bleach with 1 part bleach and 10 parts water) or a household disinfectant with a label that says EPA-registered for coronaviruses. To make a bleach solution at home, add 1 tablespoon of bleach to 1 quart (4 cups) of water. For a larger supply, add  cup of bleach to 1 gallon (16 cups) of water.  Read labels of cleaning products and follow  recommendations provided on product labels. Labels contain instructions for safe and effective use of the cleaning product including precautions you should take when applying the product, such as wearing gloves or eye protection and making sure you have good ventilation during use of the product.  Remove gloves and wash hands immediately after cleaning.  Monitor yourself for signs and symptoms of illness Caregivers and household members are considered close contacts, should monitor their health, and will be asked to limit movement outside of the home to the extent possible. Follow the monitoring steps for close contacts listed on the symptom monitoring form.   ? If you have additional questions, contact your local health department or call the epidemiologist on call at [phone] (available 24/7). ? This guidance is subject to change. For the most up-to-date guidance from [facility], please refer to their website: [email or web address].pl

## 2019-08-02 NOTE — Plan of Care (Signed)
Teaching reinforced w/pt this Am and all questions answered at this time. Discharge planning ongoing. VSS. Weaning O2 as tolerated/ WBC WNL. Completed remdesivir yesterday. LS CTA, diminished. Encouraging OOB/ambulation in room. Fair appetite, encouraging PO intake. Emotional support provided to patient. Vdg in BR. Colace given this AM, last BM 3 days ago per pt report. No c/o pain this AM. Safe environment of care maintained. Skin intact. Will continue to monitor.

## 2019-08-02 NOTE — Progress Notes (Signed)
Discharge information reviewed thoroughly with patient. All questions answered. Patient stated she has all belongings on her person for discharge. Awaiting arrival of patient's ride at this time.

## 2019-08-02 NOTE — Progress Notes (Signed)
Plan for d/c home today on RA. PIV x1 removed. Patient declined influenza vaccine. Pt will be discharged home with wife in [place], plan d/c time at approx 12:30 per discussion with patient. Attempted to reach patient's spouse, Percell Miller to give update. Voicemail left with return number.

## 2019-08-11 ENCOUNTER — Emergency Department (HOSPITAL_BASED_OUTPATIENT_CLINIC_OR_DEPARTMENT_OTHER)
Admission: EM | Admit: 2019-08-11 | Discharge: 2019-08-11 | Disposition: A | Discharge status: Home / Self Care | Payer: BC Managed Care – PPO | Attending: Emergency Medicine | Admitting: Emergency Medicine

## 2019-08-11 ENCOUNTER — Encounter (HOSPITAL_BASED_OUTPATIENT_CLINIC_OR_DEPARTMENT_OTHER): Payer: Self-pay | Admitting: Emergency Medicine

## 2019-08-11 ENCOUNTER — Other Ambulatory Visit: Payer: Self-pay

## 2019-08-11 DIAGNOSIS — Z79899 Other long term (current) drug therapy: Secondary | ICD-10-CM | POA: Diagnosis not present

## 2019-08-11 DIAGNOSIS — R519 Headache, unspecified: Secondary | ICD-10-CM | POA: Diagnosis present

## 2019-08-11 DIAGNOSIS — G43809 Other migraine, not intractable, without status migrainosus: Secondary | ICD-10-CM | POA: Diagnosis not present

## 2019-08-11 LAB — PREGNANCY, URINE: Preg Test, Ur: NEGATIVE

## 2019-08-11 MED ORDER — MAGNESIUM SULFATE 2 GM/50ML IV SOLN
2.0000 g | Freq: Once | INTRAVENOUS | Status: AC
  Administered 2019-08-11: 2 g via INTRAVENOUS
  Filled 2019-08-11: qty 50

## 2019-08-11 MED ORDER — METOCLOPRAMIDE HCL 5 MG/ML IJ SOLN
10.0000 mg | Freq: Once | INTRAMUSCULAR | Status: AC
  Administered 2019-08-11: 10 mg via INTRAVENOUS
  Filled 2019-08-11: qty 2

## 2019-08-11 MED ORDER — DIVALPROEX SODIUM 500 MG PO DR TAB
500.0000 mg | DELAYED_RELEASE_TABLET | Freq: Two times a day (BID) | ORAL | Status: DC
  Filled 2019-08-11: qty 1

## 2019-08-11 MED ORDER — DIVALPROEX SODIUM 500 MG PO DR TAB
500.0000 mg | DELAYED_RELEASE_TABLET | Freq: Once | ORAL | Status: DC
  Filled 2019-08-11: qty 1

## 2019-08-11 MED ORDER — DIVALPROEX SODIUM ER 250 MG PO TB24
ORAL_TABLET | ORAL | Status: AC
  Administered 2019-08-11: 500 mg
  Filled 2019-08-11: qty 2

## 2019-08-11 MED ORDER — DIPHENHYDRAMINE HCL 50 MG/ML IJ SOLN
12.5000 mg | Freq: Once | INTRAMUSCULAR | Status: AC
  Administered 2019-08-11: 12.5 mg via INTRAVENOUS
  Filled 2019-08-11: qty 1

## 2019-08-11 MED ORDER — KETOROLAC TROMETHAMINE 30 MG/ML IJ SOLN
15.0000 mg | Freq: Once | INTRAMUSCULAR | Status: AC
  Administered 2019-08-11: 15 mg via INTRAVENOUS
  Filled 2019-08-11: qty 1

## 2019-08-11 NOTE — ED Notes (Addendum)
Note made in error

## 2019-08-11 NOTE — ED Triage Notes (Signed)
Pt is c/o migraine headache  Started earlier today and has progressively gotten worse  States this is the worst on she has ever had  Nausea without vomiting

## 2019-08-11 NOTE — ED Provider Notes (Signed)
[facility] EMERGENCY DEPARTMENT Provider Note   CSN: [id number] Arrival date & time: 08/11/19  0030     History Chief Complaint  Patient presents with  . Migraine    Brenda Chambers is a 48 y.o. female.  The history is provided by the patient.  Migraine This is a recurrent problem. The current episode started more than 2 days ago. The problem occurs constantly. The problem has not changed since onset.Associated symptoms include headaches. Pertinent negatives include no chest pain, no abdominal pain and no shortness of breath. Nothing aggravates the symptoms. Nothing relieves the symptoms. She has tried nothing for the symptoms. The treatment provided no relief.       Past Medical History:  Diagnosis Date  . Endometriosis   . High cholesterol     Patient Active Problem List   Diagnosis Date Noted  . 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 Pneumonia due to COVID-19 virus   . Acute hypoxemic respiratory failure (HCC)   . Dyslipidemia 07/27/2019  . Endometriosis 07/27/2019    Past Surgical History:  Procedure Laterality Date  . ABDOMINAL HYSTERECTOMY    . FOOT SURGERY       OB History   No obstetric history on file.     Family History  Problem Relation Age of Onset  . Cancer Mother   . Diabetes Other     Social History   Tobacco Use  . Smoking status: Never Smoker  . Smokeless tobacco: Never Used  Substance Use Topics  . Alcohol use: Yes    Comment: occ  . Drug use: No    Home Medications Prior to Admission medications   Medication Sig Start Date End Date Taking? Authorizing Provider  albuterol (PROVENTIL) (2.5 MG/3ML) 0.083% nebulizer solution Take 2.5 mg by nebulization 3 (three) times daily. 07/26/19   [provider]  albuterol (VENTOLIN HFA) 108 (90 Base) MCG/ACT inhaler Inhale 1-2 puffs into the lungs every 4 (four) hours as needed for shortness of breath or wheezing. May take every 3 hours.  07/25/19   [provider]  estradiol (VIVELLE-DOT) 0.075 MG/24HR Place 1 patch onto the skin 2 (two) times a week. 06/19/19   [provider]  HYDROMET 5-1.5 MG/5ML syrup Take 5 mLs by mouth every 4 (four) hours as needed. 07/22/19   [provider]    Allergies    Cephalosporins  Review of Systems   Review of Systems  Constitutional: Negative for fever.  HENT: Negative for congestion and sore throat.   Eyes: Negative for photophobia and visual disturbance.  Respiratory: Negative for shortness of breath.   Cardiovascular: Negative for chest pain.  Gastrointestinal: Negative for abdominal pain.  Genitourinary: Negative for difficulty urinating.  Musculoskeletal: Negative for arthralgias.  Neurological: Positive for headaches. Negative for dizziness, tremors, seizures, syncope, facial asymmetry, speech difficulty, weakness, light-headedness and numbness.  Psychiatric/Behavioral: Negative for agitation.  All other systems reviewed and are negative.   Physical Exam Updated Vital Signs BP 107/81   Pulse 67   Temp 98.2 F (36.8 C) (Oral)   Resp 18   Ht 5\' 6"  (1.676 m)   Wt 79.8 kg   SpO2 96%   BMI 28.41 kg/m   Physical Exam Vitals and nursing note reviewed.  Constitutional:      General: She is not in acute distress.    Appearance: Normal appearance.  HENT:     Head: Normocephalic and atraumatic.     Nose: Nose normal.  Eyes:  Conjunctiva/sclera: Conjunctivae normal.     Pupils: Pupils are equal, round, and reactive to light.  Cardiovascular:     Rate and Rhythm: Normal rate and regular rhythm.     Pulses: Normal pulses.     Heart sounds: Normal heart sounds.  Pulmonary:     Effort: Pulmonary effort is normal.     Breath sounds: Normal breath sounds.  Abdominal:     General: Abdomen is flat. Bowel sounds are normal.     Tenderness: There is no abdominal tenderness. There is no guarding.  Musculoskeletal:        General: Normal range  of motion.     Cervical back: Normal range of motion and neck supple.  Skin:    General: Skin is warm and dry.     Capillary Refill: Capillary refill takes less than 2 seconds.     Coloration: Skin is not jaundiced.  Neurological:     General: No focal deficit present.     Mental Status: She is alert and oriented to person, place, and time.  Psychiatric:        Mood and Affect: Mood normal.        Behavior: Behavior normal.     ED Results / Procedures / Treatments   Labs (all labs ordered are listed, but only abnormal results are displayed) Labs Reviewed  PREGNANCY, URINE    EKG None  Radiology No results found.  Procedures Procedures (including critical care time)  Medications Ordered in ED Medications  divalproex (DEPAKOTE) DR tablet 500 mg (500 mg Oral Not Given 08/11/19 0152)  ketorolac (TORADOL) 30 MG/ML injection 15 mg (15 mg Intravenous Given 08/11/19 0138)  metoCLOPramide (REGLAN) injection 10 mg (10 mg Intravenous Given 08/11/19 0137)  magnesium sulfate IVPB 2 g 50 mL (0 g Intravenous Stopped 08/11/19 0157)  diphenhydrAMINE (BENADRYL) injection 12.5 mg (12.5 mg Intravenous Given 08/11/19 0137)  divalproex (DEPAKOTE ER) 250 MG 24 hr tablet (500 mg  Given 08/11/19 0151)    ED Course  I have reviewed the triage vital signs and the nursing notes.  Pertinent labs & imaging results that were available during my care of the patient were reviewed by me and considered in my medical decision making (see chart for details).    Brenda Chambers was evaluated in Emergency Department on 08/11/2019 for the symptoms described in the history of present illness. She was evaluated in the context of the global COVID-19 pandemic, which necessitated consideration that the patient might be at risk for infection with the SARS-CoV-2 virus that causes COVID-19. Institutional protocols and algorithms that pertain to the evaluation of patients at risk for COVID-19 are in a state of rapid  change based on information released by regulatory bodies including the CDC and federal and state organizations. These policies and algorithms were followed during the patient's care in the ED.  Final Clinical Impression(s) / ED Diagnoses Final diagnoses:  Other migraine without status migrainosus, not intractable   Return for intractable cough, coughing up blood,fevers >100.4 unrelieved by medication, shortness of breath, intractable vomiting, chest pain, shortness of breath, weakness,numbness, changes in speech, facial asymmetry,abdominal pain, passing out,Inability to tolerate liquids or food, cough, altered mental status or any concerns. No signs of systemic illness or infection. The patient is nontoxic-appearing on exam and vital signs are within normal limits.   I have reviewed the triage vital signs and the nursing notes. Pertinent labs &imaging results that were available during my care of the patient were reviewed by  me and considered in my medical decision making (see chart for details).  After history, exam, and medical workup I feel the patient has been appropriately medically screened and is safe for discharge home. Pertinent diagnoses were discussed with the patient. Patient was given return precautions Rx / DC Orders ED Discharge Orders    None       Trevor Wilkie, MD 08/11/19 [phone]

## 2019-09-04 ENCOUNTER — Other Ambulatory Visit: Payer: Self-pay

## 2021-01-13 IMAGING — DX DG CHEST 1V PORT
1 series · 1 of 1 positions shown · non-contrast
Comparison: 06/20/2011

CLINICAL DATA: Shortness of breath, COVID positive.

EXAM:
PORTABLE CHEST 1 VIEW

[chest ap]
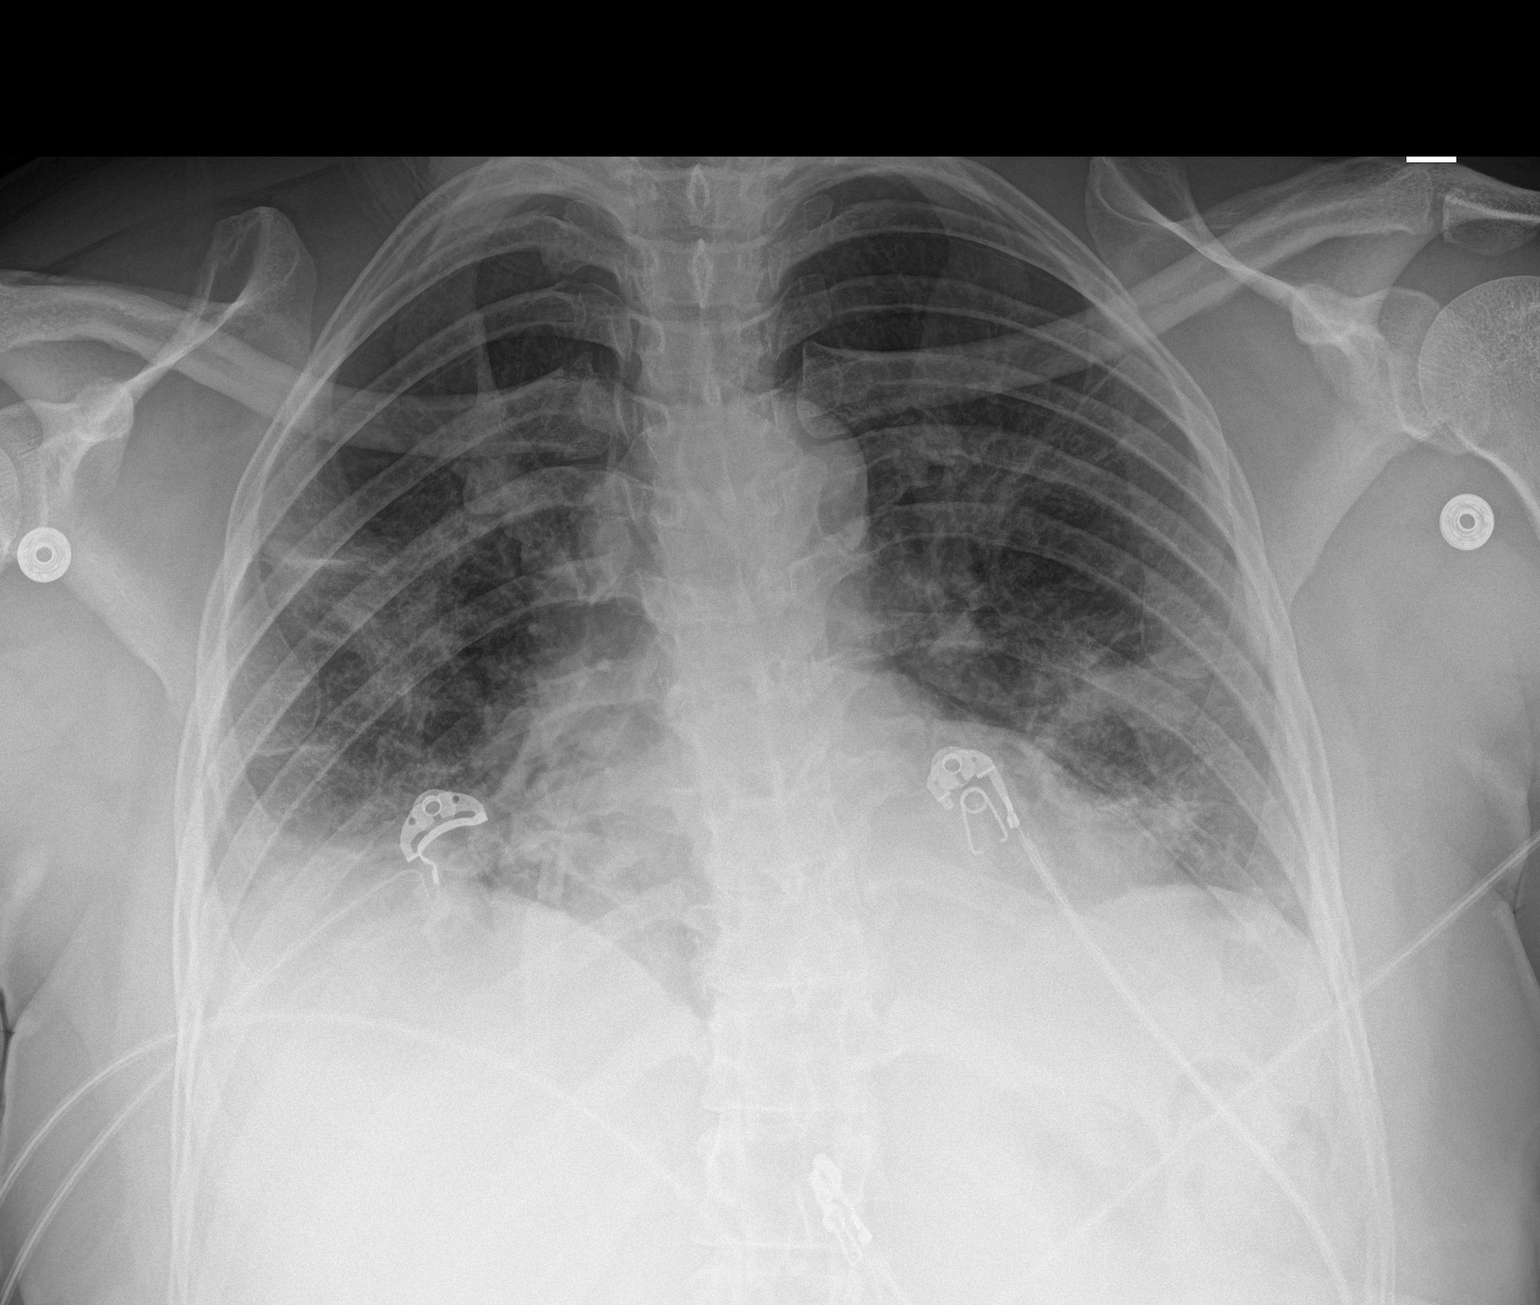

[1 of 1 positions shown; findings below may reference images not displayed]

FINDINGS: Lung volumes are low. Cardiomediastinal contours are normal
accounting for low lung volumes. Patchy opacities are seen
bilaterally, worse at the lung bases with some peripheral
predominance. Low lung volumes limit assessment. No signs of acute
bone process.
IMPRESSION: Low volume chest with findings that could be seen in the setting of
atypical or viral pneumonia.

## 2021-01-16 IMAGING — DX DG CHEST 1V PORT SAME DAY
1 series · 1 of 1 positions shown · non-contrast
Comparison: Chest x-ray dated 07/27/2019.

CLINICAL DATA: Dyspnea

EXAM:
PORTABLE CHEST 1 VIEW

[chest]
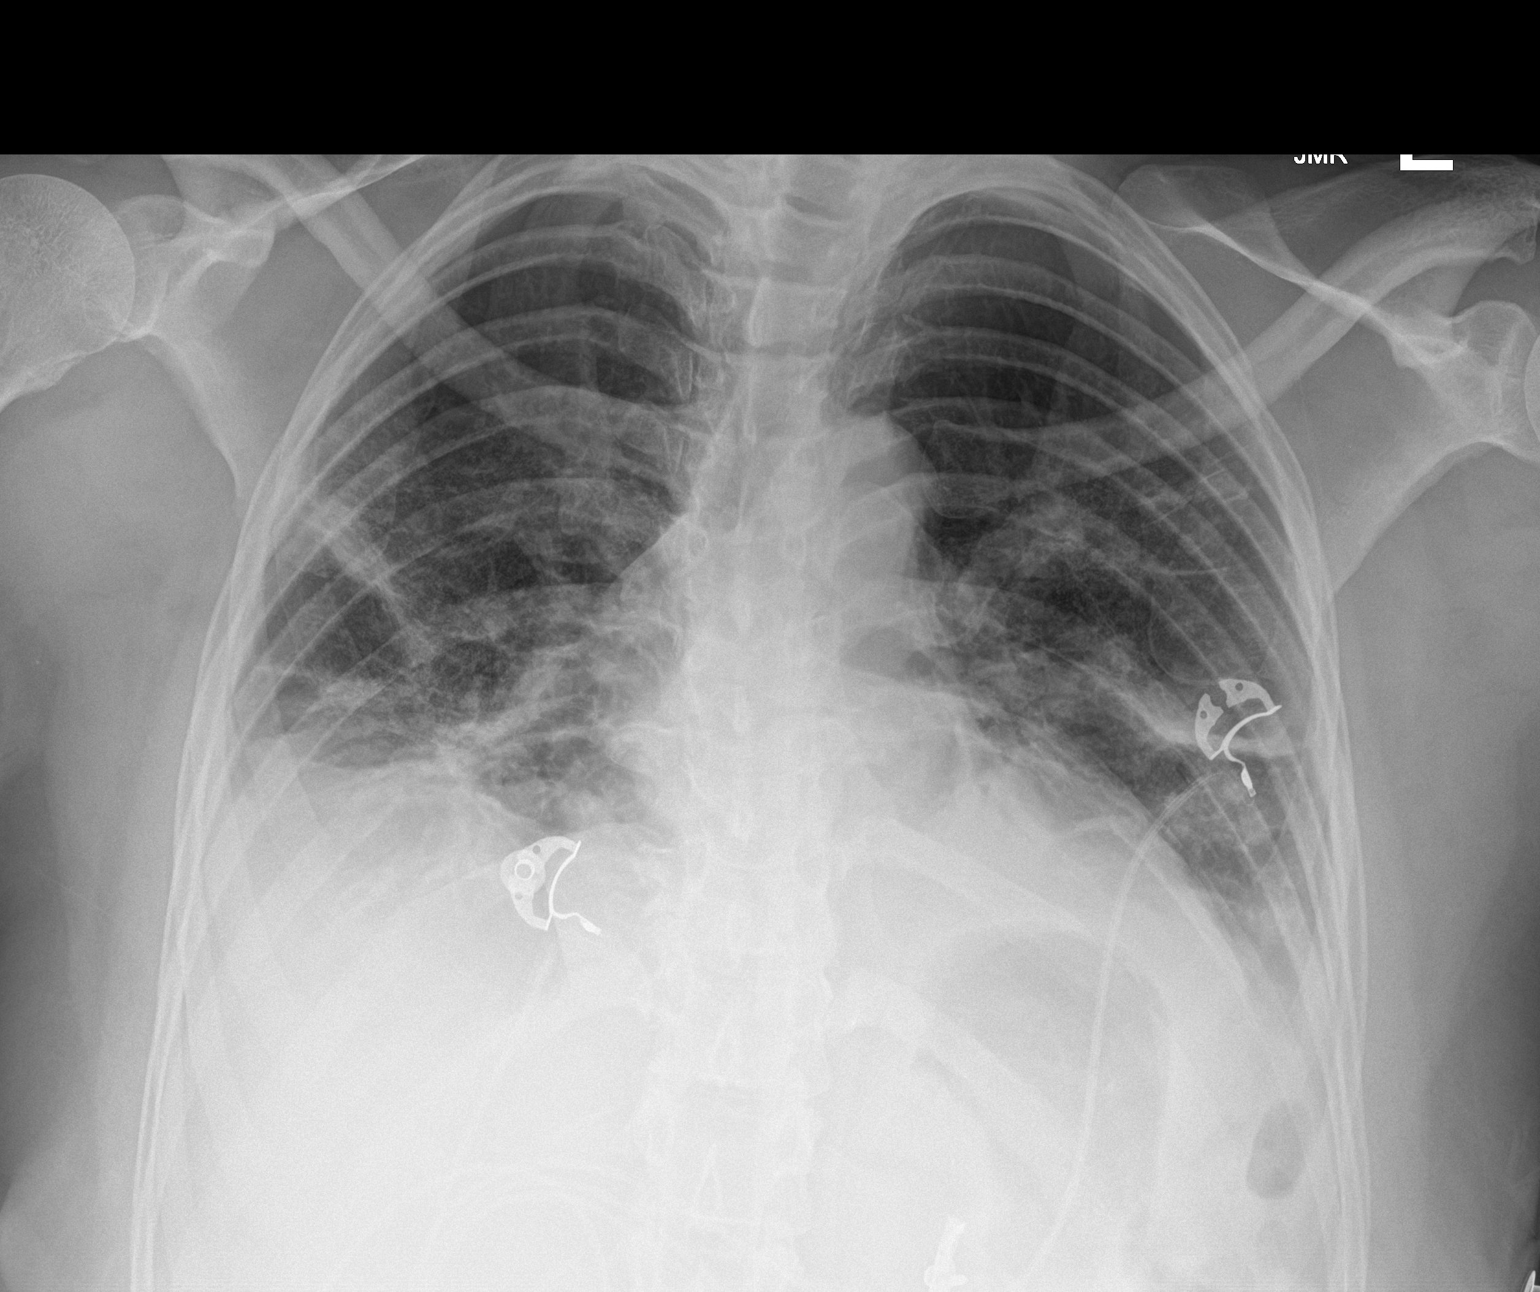

[1 of 1 positions shown; findings below may reference images not displayed]

FINDINGS: Patchy bibasilar airspace opacities, stable. Upper lungs remain
clear. Heart size and mediastinal contours are stable. Osseous
structures about the chest are unremarkable.
IMPRESSION: Stable patchy bibasilar airspace opacities, compatible with
multifocal pneumonia given the recent [id number] diagnosis.

## 2021-11-17 ENCOUNTER — Encounter (HOSPITAL_BASED_OUTPATIENT_CLINIC_OR_DEPARTMENT_OTHER): Payer: Self-pay | Admitting: *Deleted

## 2021-11-17 ENCOUNTER — Emergency Department (HOSPITAL_BASED_OUTPATIENT_CLINIC_OR_DEPARTMENT_OTHER)
Admission: EM | Admit: 2021-11-17 | Discharge: 2021-11-17 | Disposition: A | Discharge status: Home / Self Care | Payer: BC Managed Care – PPO | Attending: Emergency Medicine | Admitting: Emergency Medicine

## 2021-11-17 ENCOUNTER — Other Ambulatory Visit: Payer: Self-pay

## 2021-11-17 DIAGNOSIS — K6289 Other specified diseases of anus and rectum: Secondary | ICD-10-CM | POA: Diagnosis present

## 2021-11-17 DIAGNOSIS — K644 Residual hemorrhoidal skin tags: Secondary | ICD-10-CM | POA: Diagnosis not present

## 2021-11-17 DIAGNOSIS — K649 Unspecified hemorrhoids: Secondary | ICD-10-CM

## 2021-11-17 MED ORDER — OXYCODONE HCL 5 MG PO TABS: 5.0000 mg | ORAL_TABLET | ORAL | 0 refills | Status: AC | PRN

## 2021-11-17 MED ORDER — LIDOCAINE HCL URETHRAL/MUCOSAL 2 % EX GEL
1.0000 "application " | Freq: Once | CUTANEOUS | Status: AC
  Administered 2021-11-17: 1 via TOPICAL
  Filled 2021-11-17: qty 11

## 2021-11-17 MED ORDER — LIDOCAINE HCL 2 % EX GEL: 1.0000 "application " | CUTANEOUS | 0 refills | Status: AC | PRN

## 2021-11-17 NOTE — Discharge Instructions (Addendum)
Use the medications prescribed.  Use caution as the pain medicine may make her constipation worse.  I would recommend starting a stool softener as well as MiraLAX.  Use warm sitz bath's at home multiple times daily.  Follow-up with general surgery for definitive management ?

## 2021-11-17 NOTE — ED Notes (Signed)
Pt has hemorrhoids, and states she is in pain, was under the impression we could remove it here.  Explained to pt we do not do that here.   ? ?

## 2021-11-17 NOTE — ED Notes (Signed)
Chaperone for EDP for rectal exam. Pt tolerated well.  

## 2021-11-17 NOTE — ED Provider Notes (Signed)
?[facility] EMERGENCY DEPARTMENT ?Provider Note ? ? ?CSN: [id number] ?Arrival date & time: 11/17/21  1710 ? ?  ?History ? ?Chief Complaint  ?Patient presents with  ? Hemorrhoids  ? ? ?Brenda Chambers is a 51 y.o. female with history of hemorrhoids here for evaluation of rectal pain.  Was seen by PCP earlier today.  Diagnosed with hemorrhoid, sent here for further evaluation.  States she was recently put on tramadol due to recent carpal tunnel surgery which caused her to be constipated.  She has had her hemorrhoids excised previously.  States PCP has sent her in a referral to general surgery.  She is unable to sleep due to the pain.  Doing topical OTC hemorrhoid cream without relief.  No rectal bleeding.  No fever, abdominal pain. ? ?HPI ? ?  ? ?Home Medications ?Prior to Admission medications   ?Medication Sig Start Date End Date Taking? Authorizing Provider  ?lidocaine (XYLOCAINE) 2 % jelly Apply 1 application. topically as needed. 11/17/21  Yes Dava Rensch A, PA-C  ?oxyCODONE (ROXICODONE) 5 MG immediate release tablet Take 1 tablet (5 mg total) by mouth every 4 (four) hours as needed for severe pain. 11/17/21  Yes Lewis Keats A, PA-C  ?albuterol (PROVENTIL) (2.5 MG/3ML) 0.083% nebulizer solution Take 2.5 mg by nebulization 3 (three) times daily. 07/26/19   [provider]  ?albuterol (VENTOLIN HFA) 108 (90 Base) MCG/ACT inhaler Inhale 1-2 puffs into the lungs every 4 (four) hours as needed for shortness of breath or wheezing. May take every 3 hours. 07/25/19   [provider]  ?estradiol (VIVELLE-DOT) 0.075 MG/24HR Place 1 patch onto the skin 2 (two) times a week. 06/19/19   [provider]  ?HYDROMET 5-1.5 MG/5ML syrup Take 5 mLs by mouth every 4 (four) hours as needed. 07/22/19   [provider]  ?   ? ?Allergies    ?Cephalosporins   ? ?Review of Systems   ?Review of Systems  ?Constitutional: Negative.   ?HENT: Negative.    ?Respiratory: Negative.     ?Gastrointestinal:  Positive for rectal pain.  ?Genitourinary: Negative.   ?Musculoskeletal: Negative.   ?Skin: Negative.   ?Neurological: Negative.   ?All other systems reviewed and are negative. ? ?Physical Exam ?Updated Vital Signs ?BP 131/90   Pulse (!) 56   Temp 97.9 ?F (36.6 ?C)   Resp 20   Ht 5\' 6"  (1.676 m)   Wt 79.8 kg   SpO2 99%   BMI 28.41 kg/m?  ?Physical Exam ?Vitals and nursing note reviewed.  ?Constitutional:   ?   General: She is not in acute distress. ?   Appearance: She is well-developed. She is not ill-appearing, toxic-appearing or diaphoretic.  ?HENT:  ?   Head: Atraumatic.  ?Eyes:  ?   Pupils: Pupils are equal, round, and reactive to light.  ?Cardiovascular:  ?   Rate and Rhythm: Normal rate.  ?Pulmonary:  ?   Effort: No respiratory distress.  ?Abdominal:  ?   General: There is no distension.  ?Genitourinary: ?   Rectum: Tenderness and external hemorrhoid present. No anal fissure.  ? ? ?   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 ?Hemorrhoid approximately 1 cm in size.  No surrounding erythema, warmth, fluctuance or induration. ?No anal fissure ?Musculoskeletal:     ?   General: Normal range of motion.  ?   Cervical back: Normal range of motion.  ?Skin: ?   General: Skin is warm and dry.  ?Neurological:  ?  General: No focal deficit present.  ?   Mental Status: She is alert.  ?Psychiatric:     ?   Mood and Affect: Mood normal.  ? ? ?ED Results / Procedures / Treatments   ?Labs ?(all labs ordered are listed, but only abnormal results are displayed) ?Labs Reviewed - No data to display ? ?EKG ?None ? ?Radiology ?No results found. ? ?Procedures ?Procedures  ? ? ?Medications Ordered in ED ?Medications  ?lidocaine (XYLOCAINE) 2 % jelly 1 application. (1 application. Topical Given 11/17/21 1749)  ? ? ?ED Course/ Medical Decision Making/ A&P ?  ? ?51 year old here for evaluation of hemorrhoid.  Afebrile, nonseptic, not ill-appearing. Seen by PCP earlier today, sent here for further evaluation.   States she has needed surgical management previously for hemorrhoids.  PCP did send in referral to general surgery per patient.  On exam she has external tender hemorrhoid 1 cm in size.  No fissures, evidence of peri-/rectal abscess.  No evidence of forneirs gangrene.  Patient was intermittently on tramadol for hand pain due to recent carpal tunnel surgery and developed constipation from this.  Suspect her hemorrhoids are from her constipation.  I discussed further follow-up with general surgery for excision of her hemorrhoid.  We will treat with topical and oral pain management however did discuss with patient possibility of ultimately the pain medication can cause further constipation agitating her rectal pain.  Encourage sitz bath at home. I discussed stool softener, MiraLAX.  She will return for new or worsening symptoms. ? ?The patient has been appropriately medically screened and/or stabilized in the ED. I have low suspicion for any other emergent medical condition which would require further screening, evaluation or treatment in the ED or require inpatient management. ? ?Patient is hemodynamically stable and in no acute distress.  Patient able to ambulate in department prior to ED.  Evaluation does not show acute pathology that would require ongoing or additional emergent interventions while in the emergency department or further inpatient treatment.  I have discussed the diagnosis with the patient and answered all questions.  Pain is been managed while in the emergency department and patient has no further complaints prior to discharge.  Patient is comfortable with plan discussed in room and is stable for discharge at this time.  I have discussed strict return precautions for returning to the emergency department.  Patient was encouraged to follow-up with PCP/specialist refer to at discharge.  ? ?                        ?Medical Decision Making ? ? ? ? ? ? ? ? ? ?Final Clinical Impression(s) / ED  Diagnoses ?Final diagnoses:  ?Hemorrhoids, unspecified hemorrhoid type  ? ? ?Rx / DC Orders ?ED Discharge Orders   ? ?      Ordered  ?  oxyCODONE (ROXICODONE) 5 MG immediate release tablet  Every 4 hours PRN       ? 11/17/21 1754  ?  lidocaine (XYLOCAINE) 2 % jelly  As needed       ? 11/17/21 1754  ? ?  ?  ? ?  ? ? ?  ?Koren Plyler A, PA-C ?11/17/21 1801 ? ?  ?Virgina Norfolk, DO ?11/17/21 1913 ? ?

## 2021-11-17 NOTE — ED Triage Notes (Signed)
Here MD told her she has a bad hemorrhoid and told her to come here to have it removed.  ?
